# Patient Record
Sex: Male | Born: 1970
Health system: Southern US, Community
[De-identification: ages and names within clinical notes are randomized; demographics above are authoritative.]

## PROBLEM LIST (undated history)

## (undated) HISTORY — PX: HERNIA REPAIR: SHX51

## (undated) HISTORY — PX: ANTERIOR CRUCIATE LIGAMENT REPAIR: SHX115

## (undated) HISTORY — PX: TONSILLECTOMY: SUR1361

---

## 2011-02-15 ENCOUNTER — Emergency Department (HOSPITAL_COMMUNITY): Payer: Self-pay

## 2011-02-15 ENCOUNTER — Emergency Department (HOSPITAL_COMMUNITY)
Admission: EM | Admit: 2011-02-15 | Discharge: 2011-02-15 | Disposition: A | Discharge status: Home / Self Care | Payer: Self-pay | Attending: Emergency Medicine | Admitting: Emergency Medicine

## 2011-02-15 DIAGNOSIS — R079 Chest pain, unspecified: Secondary | ICD-10-CM | POA: Insufficient documentation

## 2011-02-15 DIAGNOSIS — R109 Unspecified abdominal pain: Secondary | ICD-10-CM | POA: Insufficient documentation

## 2011-02-15 DIAGNOSIS — R1011 Right upper quadrant pain: Secondary | ICD-10-CM | POA: Insufficient documentation

## 2011-02-15 LAB — COMPREHENSIVE METABOLIC PANEL
ALT: 18 U/L (ref 0–53)
AST: 18 U/L (ref 0–37)
CO2: 29 mEq/L (ref 19–32)
Calcium: 9.5 mg/dL (ref 8.4–10.5)
Chloride: 106 mEq/L (ref 96–112)
Creatinine, Ser: 0.81 mg/dL (ref 0.4–1.5)
GFR calc Af Amer: >60 mL/min (ref >60)
GFR calc non Af Amer: >60 mL/min (ref >60)
Glucose, Bld: 97 mg/dL (ref 70–99)
Sodium: 139 mEq/L (ref 135–145)
Total Bilirubin: 1 mg/dL (ref 0.3–1.2)

## 2011-02-15 LAB — CBC
HCT: 44.3 % (ref 39.0–52.0)
Hemoglobin: 15.7 g/dL (ref 13.0–17.0)
MCH: 33.4 pg (ref 26.0–34.0)
MCHC: 35.4 g/dL (ref 30.0–36.0)
RBC: 4.7 MIL/uL (ref 4.22–5.81)

## 2011-02-15 LAB — DIFFERENTIAL
Lymphocytes Relative: 24 % (ref 12–46)
Monocytes Absolute: 0.8 10*3/uL (ref 0.1–1.0)
Monocytes Relative: 8 % (ref 3–12)
Neutro Abs: 6.8 10*3/uL (ref 1.7–7.7)
Neutrophils Relative %: 66 % (ref 43–77)

## 2011-02-15 LAB — LIPASE, BLOOD: Lipase: 23 U/L (ref 11–59)

## 2012-03-28 ENCOUNTER — Emergency Department (HOSPITAL_COMMUNITY)
Admission: EM | Admit: 2012-03-28 | Discharge: 2012-03-28 | Disposition: A | Discharge status: Home / Self Care | Payer: Managed Care, Other (non HMO) | Attending: Emergency Medicine | Admitting: Emergency Medicine

## 2012-03-28 ENCOUNTER — Encounter (HOSPITAL_COMMUNITY): Payer: Self-pay | Admitting: Emergency Medicine

## 2012-03-28 ENCOUNTER — Emergency Department (HOSPITAL_COMMUNITY): Payer: Managed Care, Other (non HMO)

## 2012-03-28 DIAGNOSIS — R079 Chest pain, unspecified: Secondary | ICD-10-CM | POA: Insufficient documentation

## 2012-03-28 DIAGNOSIS — T148XXA Other injury of unspecified body region, initial encounter: Secondary | ICD-10-CM

## 2012-03-28 DIAGNOSIS — F17200 Nicotine dependence, unspecified, uncomplicated: Secondary | ICD-10-CM | POA: Insufficient documentation

## 2012-03-28 DIAGNOSIS — X58XXXA Exposure to other specified factors, initial encounter: Secondary | ICD-10-CM | POA: Insufficient documentation

## 2012-03-28 DIAGNOSIS — IMO0002 Reserved for concepts with insufficient information to code with codable children: Secondary | ICD-10-CM | POA: Insufficient documentation

## 2012-03-28 DIAGNOSIS — R109 Unspecified abdominal pain: Secondary | ICD-10-CM

## 2012-03-28 LAB — URINALYSIS, ROUTINE W REFLEX MICROSCOPIC
Glucose, UA: NEGATIVE mg/dL
Leukocytes, UA: NEGATIVE
Protein, ur: NEGATIVE mg/dL
Specific Gravity, Urine: 1.025 (ref 1.005–1.030)

## 2012-03-28 LAB — HEPATIC FUNCTION PANEL
ALT: 10 U/L (ref 0–53)
AST: 13 U/L (ref 0–37)
Albumin: 4.5 g/dL (ref 3.5–5.2)
Bilirubin, Direct: 0.1 mg/dL (ref 0.0–0.3)
Total Protein: 7.5 g/dL (ref 6.0–8.3)

## 2012-03-28 LAB — CBC
Hemoglobin: 15.9 g/dL (ref 13.0–17.0)
MCH: 32.8 pg (ref 26.0–34.0)
MCHC: 35 g/dL (ref 30.0–36.0)
Platelets: 232 10*3/uL (ref 150–400)

## 2012-03-28 LAB — DIFFERENTIAL
Basophils Absolute: 0 10*3/uL (ref 0.0–0.1)
Basophils Relative: 0 % (ref 0–1)
Eosinophils Absolute: 0.2 10*3/uL (ref 0.0–0.7)
Neutro Abs: 5.6 10*3/uL (ref 1.7–7.7)
Neutrophils Relative %: 60 % (ref 43–77)

## 2012-03-28 LAB — BASIC METABOLIC PANEL
BUN: 12 mg/dL (ref 6–23)
Chloride: 101 mEq/L (ref 96–112)
GFR calc Af Amer: >90 mL/min (ref >90)
GFR calc non Af Amer: >90 mL/min (ref >90)
Potassium: 3.9 mEq/L (ref 3.5–5.1)
Sodium: 137 mEq/L (ref 135–145)

## 2012-03-28 MED ORDER — HYDROCODONE-ACETAMINOPHEN 7.5-325 MG PO TABS: 1.0000 | ORAL_TABLET | ORAL | Status: AC | PRN

## 2012-03-28 MED ORDER — ONDANSETRON HCL 4 MG/2ML IJ SOLN
4.0000 mg | Freq: Once | INTRAMUSCULAR | Status: AC
  Administered 2012-03-28: 4 mg via INTRAVENOUS
  Filled 2012-03-28: qty 2

## 2012-03-28 MED ORDER — DICLOFENAC SODIUM 75 MG PO TBEC: 75.0000 mg | DELAYED_RELEASE_TABLET | Freq: Two times a day (BID) | ORAL | Status: AC

## 2012-03-28 MED ORDER — IOHEXOL 300 MG/ML  SOLN
100.0000 mL | Freq: Once | INTRAMUSCULAR | Status: AC | PRN
  Administered 2012-03-28: 100 mL via INTRAVENOUS

## 2012-03-28 MED ORDER — HYDROMORPHONE HCL PF 1 MG/ML IJ SOLN
1.0000 mg | Freq: Once | INTRAMUSCULAR | Status: AC
  Administered 2012-03-28: 1 mg via INTRAVENOUS
  Filled 2012-03-28: qty 1

## 2012-03-28 MED ORDER — BACLOFEN 10 MG PO TABS: 10.0000 mg | ORAL_TABLET | Freq: Three times a day (TID) | ORAL | Status: AC

## 2012-03-28 NOTE — Discharge Instructions (Signed)
Your lab tests, x-rays, and CT scans are negative for acute findings today. Please use a heating pad to the right flank area at 15-20 minute intervals. Please use baclofen 3 times daily to relax the muscle, diclofenac 2 times daily with food for inflammation, and Norco 7.5 every 4 hours as needed for pain. The baclofen and the Norco may cause drowsiness, as well as constipation. Please use with caution.Flank Pain Flank pain refers to pain that is located on the side of the body between the upper abdomen and the back. It can be caused by many things. CAUSES  Some of the more common causes of flank pain include:  Muscle strain.   Muscle spasms.   A disease of your spine (vertebral disk disease).   A lung infection (pneumonia).   Fluid around your lungs (pulmonary edema).   A kidney infection.   Kidney stones.   A very painful skin rash on only one side of your body (shingles).   Gallbladder disease.  DIAGNOSIS  Blood tests, urine tests, and X-rays may help your caregiver determine what is wrong. TREATMENT  The treatment of pain depends on the cause. Your caregiver will determine what treatment will work best for you. HOME CARE INSTRUCTIONS   Home care will depend on the cause of your pain.   Some medications may help relieve the pain. Take medication for relief of pain as directed by your caregiver.   Tell your caregiver about any changes in your pain.   Follow up with your caregiver.  SEEK IMMEDIATE MEDICAL CARE IF:   Your pain is not controlled with medication.   The pain increases.   You have abdominal pain.   You have shortness of breath.   You have persistent nausea or vomiting.   You have swelling in your abdomen.   You feel faint or pass out.   You have a temperature by mouth above 102 F (38.9 C), not controlled by medicine.  MAKE SURE YOU:   Understand these instructions.   Will watch your condition.   Will get help right away if you are not doing  well or get worse.  Document Released: 01/26/2006 Document Revised: 11/24/2011 Document Reviewed: 05/22/2010 [facility] Patient Information 2012 [place], [place].

## 2012-03-28 NOTE — ED Notes (Signed)
Pt c/o rt flank pain x one week.

## 2012-03-28 NOTE — ED Provider Notes (Signed)
   Medical screening examination/treatment/procedure(s) were performed by non-physician practitioner and as supervising physician I was immediately available for consultation/collaboration.  Shelda Jakes, MD 03/28/12 [phone]

## 2012-03-28 NOTE — ED Provider Notes (Signed)
History     CSN: [id number]  Arrival date & time 03/28/12  9562   First MD Initiated Contact with Patient 03/28/12 [phone]      Chief Complaint  Patient presents with  . Flank Pain    (Consider location/radiation/quality/duration/timing/severity/associated sxs/prior treatment) Patient is a 41 y.o. male presenting with flank pain. The history is provided by the patient.  Flank Pain This is a recurrent problem. The current episode started in the past 7 days. The problem has been gradually worsening. Pertinent negatives include no abdominal pain, arthralgias, change in bowel habit, chest pain, chills, coughing, fever, nausea, neck pain or vomiting. Associated symptoms comments: No dysuria. The symptoms are aggravated by exertion (lifting). He has tried NSAIDs for the symptoms. The treatment provided no relief.    History reviewed. No pertinent past medical history.  Past Surgical History  Procedure Date  . Anterior cruciate ligament repair   . Tonsillectomy   . Hernia repair     History reviewed. No pertinent family history.  History  Substance Use Topics  . Smoking status: Current Everyday Smoker    Types: Cigarettes  . Smokeless tobacco: Not on file  . Alcohol Use: No      Review of Systems  Constitutional: Negative for fever, chills and activity change.       All ROS Neg except as noted in HPI  HENT: Negative for nosebleeds and neck pain.   Eyes: Negative for photophobia and discharge.  Respiratory: Negative for cough, shortness of breath and wheezing.   Cardiovascular: Negative for chest pain and palpitations.  Gastrointestinal: Negative for nausea, vomiting, abdominal pain, blood in stool and change in bowel habit.  Genitourinary: Positive for flank pain. Negative for dysuria, frequency and hematuria.  Musculoskeletal: Negative for back pain and arthralgias.  Skin: Negative.   Neurological: Negative for dizziness, seizures and speech difficulty.    Psychiatric/Behavioral: Negative for hallucinations and confusion.    Allergies  Review of patient's allergies indicates no known allergies.  Home Medications  No current outpatient prescriptions on file.  BP 120/74  Pulse 70  Temp(Src) 98.1 F (36.7 C) (Oral)  Resp 17  Ht 5\' 7"  (1.702 m)  Wt 160 lb (72.576 kg)  BMI 25.06 kg/m2  SpO2 95%  Physical Exam  Nursing note and vitals reviewed. Constitutional: He is oriented to person, place, and time. He appears well-developed and well-nourished.  Non-toxic appearance.  HENT:  Head: Normocephalic.  Right Ear: Tympanic membrane and external ear normal.  Left Ear: Tympanic membrane and external ear normal.  Eyes: EOM and lids are normal. Pupils are equal, round, and reactive to light.  Neck: Normal range of motion. Neck supple. Carotid bruit is not present.  Cardiovascular: Normal rate, regular rhythm, normal heart sounds, intact distal pulses and normal pulses.   Pulmonary/Chest: Breath sounds normal. No respiratory distress.       Pain to palpation and movement of the right flank area. No hot areas. No palpable deformity appreciated.  Abdominal: Soft. Bowel sounds are normal.       Right upper quadrant abdomen tenderness to palpation. No CVA tenderness.  Musculoskeletal: Normal range of motion.  Lymphadenopathy:       Head (right side): No submandibular adenopathy present.       Head (left side): No submandibular adenopathy present.    He has no cervical adenopathy.  Neurological: He is alert and oriented to person, place, and time. He has normal strength. No cranial nerve deficit or sensory deficit.  Skin:  Skin is warm and dry.  Psychiatric: He has a normal mood and affect. His speech is normal.    ED Course  Procedures (including critical care time)  Labs Reviewed - No data to display No results found.   No diagnosis found.    MDM  I have reviewed nursing notes, vital signs, and all appropriate lab and imaging  results for this patient. Complete blood count, basic metabolic panel, lipase, and CT abdomen all negative for acute findings. Chest x-ray negative for acute findings. Suspect musculoskeletal related flank pain. Plan at this time will be for the use of a heating pad to the flank area. Baclofen 3 times daily, Voltaren 2 times daily with food, and Norco 7.5 mg every 4-6 hours as needed.       Kathie Dike, [place] 03/28/12 1146

## 2013-10-24 ENCOUNTER — Ambulatory Visit (INDEPENDENT_AMBULATORY_CARE_PROVIDER_SITE_OTHER): Payer: 59 | Admitting: *Deleted

## 2013-10-24 DIAGNOSIS — Z23 Encounter for immunization: Secondary | ICD-10-CM

## 2014-04-23 ENCOUNTER — Encounter: Payer: Self-pay | Admitting: Family Medicine

## 2014-04-23 ENCOUNTER — Ambulatory Visit (INDEPENDENT_AMBULATORY_CARE_PROVIDER_SITE_OTHER): Payer: 59 | Admitting: Family Medicine

## 2014-04-23 VITALS — BP 130/82 | Temp 97.6°F | Ht 67.0 in | Wt 148.0 lb

## 2014-04-23 DIAGNOSIS — Z720 Tobacco use: Secondary | ICD-10-CM | POA: Insufficient documentation

## 2014-04-23 DIAGNOSIS — A088 Other specified intestinal infections: Secondary | ICD-10-CM

## 2014-04-23 DIAGNOSIS — A084 Viral intestinal infection, unspecified: Secondary | ICD-10-CM

## 2014-04-23 DIAGNOSIS — F17200 Nicotine dependence, unspecified, uncomplicated: Secondary | ICD-10-CM

## 2014-04-23 MED ORDER — ONDANSETRON 8 MG PO TBDP: 8.0000 mg | ORAL_TABLET | Freq: Three times a day (TID) | ORAL | Status: DC | PRN

## 2014-04-23 NOTE — Progress Notes (Signed)
   Subjective:    Patient ID: Jacob Oconnor, male    DOB: 04/06/1971, 43 y.o.   MRN: [id number]  Emesis  This is a new problem. The current episode started yesterday. The problem occurs 2 to 4 times per day. The emesis has an appearance of stomach contents and bile. Associated symptoms include abdominal pain, chills, diarrhea and sweats. Treatments tried: Ibu. The treatment provided mild relief.  Diarrhea  Associated symptoms include abdominal pain, chills, sweats and vomiting.   pmh benign Tried ibuprofen  Review of Systems  Constitutional: Positive for chills.  Gastrointestinal: Positive for vomiting, abdominal pain and diarrhea.       Objective:   Physical Exam  Lungs are clear hearts regular abdomen soft no guarding rebound or tenderness mucous membranes moist skin turgor good capillary refill good vital signs noted      Assessment & Plan:  1. Viral gastroenteritis Viral gastroenteritis supportive measures discussed followup if ongoing troubles medications and then may need a shot of Phenergan if worse call us if any issues should be better over the next 48 hours

## 2014-04-23 NOTE — Patient Instructions (Signed)
Viral Gastroenteritis Viral gastroenteritis is also known as stomach flu. This condition affects the stomach and intestinal tract. It can cause sudden diarrhea and vomiting. The illness typically lasts 3 to 8 days. Most people develop an immune response that eventually gets rid of the virus. While this natural response develops, the virus can make you quite ill. CAUSES  Many different viruses can cause gastroenteritis, such as rotavirus or noroviruses. You can catch one of these viruses by consuming contaminated food or water. You may also catch a virus by sharing utensils or other personal items with an infected person or by touching a contaminated surface. SYMPTOMS  The most common symptoms are diarrhea and vomiting. These problems can cause a severe loss of body fluids (dehydration) and a body salt (electrolyte) imbalance. Other symptoms may include:  Fever.  Headache.  Fatigue.  Abdominal pain. DIAGNOSIS  Your caregiver can usually diagnose viral gastroenteritis based on your symptoms and a physical exam. A stool sample may also be taken to test for the presence of viruses or other infections. TREATMENT  This illness typically goes away on its own. Treatments are aimed at rehydration. The most serious cases of viral gastroenteritis involve vomiting so severely that you are not able to keep fluids down. In these cases, fluids must be given through an intravenous line (IV). HOME CARE INSTRUCTIONS   Drink enough fluids to keep your urine clear or pale yellow. Drink small amounts of fluids frequently and increase the amounts as tolerated.  Ask your caregiver for specific rehydration instructions.  Avoid:  Foods high in sugar.  Alcohol.  Carbonated drinks.  Tobacco.  Juice.  Caffeine drinks.  Extremely hot or cold fluids.  Fatty, greasy foods.  Too much intake of anything at one time.  Dairy products until 24 to 48 hours after diarrhea stops.  You may consume probiotics.  Probiotics are active cultures of beneficial bacteria. They may lessen the amount and number of diarrheal stools in adults. Probiotics can be found in yogurt with active cultures and in supplements.  Wash your hands well to avoid spreading the virus.  Only take over-the-counter or prescription medicines for pain, discomfort, or fever as directed by your caregiver. Do not give aspirin to children. Antidiarrheal medicines are not recommended.  Ask your caregiver if you should continue to take your regular prescribed and over-the-counter medicines.  Keep all follow-up appointments as directed by your caregiver. SEEK IMMEDIATE MEDICAL CARE IF:   You are unable to keep fluids down.  You do not urinate at least once every 6 to 8 hours.  You develop shortness of breath.  You notice blood in your stool or vomit. This may look like coffee grounds.  You have abdominal pain that increases or is concentrated in one small area (localized).  You have persistent vomiting or diarrhea.  You have a fever.  The patient is a child younger than 3 months, and he or she has a fever.  The patient is a child older than 3 months, and he or she has a fever and persistent symptoms.  The patient is a child older than 3 months, and he or she has a fever and symptoms suddenly get worse.  The patient is a baby, and he or she has no tears when crying. MAKE SURE YOU:   Understand these instructions.  Will watch your condition.  Will get help right away if you are not doing well or get worse. Document Released: 12/05/2005 Document Revised: 02/27/2012 Document Reviewed: 09/21/2011   [employer] Patient Information 2014 [employer].  

## 2014-12-16 ENCOUNTER — Encounter: Payer: Self-pay | Admitting: Family Medicine

## 2014-12-16 ENCOUNTER — Ambulatory Visit (INDEPENDENT_AMBULATORY_CARE_PROVIDER_SITE_OTHER): Payer: PRIVATE HEALTH INSURANCE | Admitting: Family Medicine

## 2014-12-16 VITALS — Temp 98.1°F | Ht 67.0 in | Wt 144.0 lb

## 2014-12-16 DIAGNOSIS — B349 Viral infection, unspecified: Secondary | ICD-10-CM

## 2014-12-16 DIAGNOSIS — J208 Acute bronchitis due to other specified organisms: Secondary | ICD-10-CM

## 2014-12-16 MED ORDER — LEVOFLOXACIN 500 MG PO TABS: 500.0000 mg | ORAL_TABLET | Freq: Every day | ORAL | Status: DC

## 2014-12-16 NOTE — Progress Notes (Deleted)
   Subjective:    Patient ID: Jacob Oconnor, male    DOB: 09/21/1971, 43 y.o.   MRN: [id number]  HPI    Review of Systems     Objective:   Physical Exam        Assessment & Plan:

## 2014-12-16 NOTE — Progress Notes (Signed)
   Subjective:    Patient ID: Jacob Oconnor, male    DOB: 12/04/1971, 43 y.o.   MRN: [id number]  Cough This is a new problem. The current episode started 1 to 4 weeks ago. Associated symptoms include a fever (a couple times), nasal congestion, rhinorrhea and shortness of breath (occasional). Pertinent negatives include no chest pain, ear pain or wheezing.   Patient is a smoker denies any severe troubles currently except for cough that has lingered on for the past couple weeks with increased congestion and coughing   Review of Systems  Constitutional: Positive for fever (a couple times) and fatigue. Negative for activity change.  HENT: Positive for congestion and rhinorrhea. Negative for ear pain.   Eyes: Negative for discharge.  Respiratory: Positive for cough and shortness of breath (occasional). Negative for wheezing.   Cardiovascular: Negative for chest pain.       Objective:   Physical Exam Eardrums normal throat normal neck supple lungs there is some chest congestion noted on the right side but no crackles or rails not respiratory distress       Assessment & Plan:  Viral syndrome Severe bronchitis versus early pneumonia Antibiotics prescribed warning signs discussed X-rays injections or other treatments not necessary currently Patient was counseled to quit smoking Patient was told that if he does not completely clear of the cough over the next 10-14 days he needs to let us know and we will set him up for chest x-ray and follow-up

## 2016-02-17 ENCOUNTER — Telehealth: Payer: Self-pay | Admitting: Family Medicine

## 2016-02-17 MED ORDER — ONDANSETRON 8 MG PO TBDP: 8.0000 mg | ORAL_TABLET | Freq: Three times a day (TID) | ORAL | Status: DC | PRN

## 2016-02-17 NOTE — Telephone Encounter (Signed)
Notified patient the approach to this is diluted Gatorade. One half liquid Gatorade with one half water, Zofran 8 mg 1 3 times a day when necessary nausea #20 one may use disintegrating tablets if necessary, 2 refills, Imodium when necessary, if persistent symptoms along with inability to keep anything down may end up needing they have to go to the ER especially if severe dizziness lack of urination etc. Med sent to pharmacy. Patient verbalized understanding.

## 2016-02-17 NOTE — Telephone Encounter (Signed)
The approach to this is diluted Gatorade. One half liquid Gatorade with one half water, Zofran 8 mg 1 3 times a day when necessary nausea #20 one may use disintegrating tablets if necessary, 2 refills, Imodium when necessary, if persistent symptoms along with inability to keep anything down may end up needing they have to go to the ER especially if severe dizziness lack of urination etc.

## 2016-02-17 NOTE — Telephone Encounter (Signed)
Pt is with no fever but has been having diarrhea, vomiting and nausea for  2 1/2 days would like to know if there is anything that can be sent in for him.  As well as is there anything he can be doing at home to keep himself from being Dehydrated.       [facility]

## 2016-02-19 ENCOUNTER — Telehealth: Payer: Self-pay | Admitting: Family Medicine

## 2016-02-19 NOTE — Telephone Encounter (Signed)
Error

## 2016-08-04 ENCOUNTER — Ambulatory Visit (INDEPENDENT_AMBULATORY_CARE_PROVIDER_SITE_OTHER): Payer: BLUE CROSS/BLUE SHIELD | Admitting: Family Medicine

## 2016-08-04 ENCOUNTER — Encounter: Payer: Self-pay | Admitting: Family Medicine

## 2016-08-04 VITALS — BP 114/72 | Temp 98.4°F | Ht 68.0 in | Wt 154.0 lb

## 2016-08-04 DIAGNOSIS — B9689 Other specified bacterial agents as the cause of diseases classified elsewhere: Secondary | ICD-10-CM

## 2016-08-04 DIAGNOSIS — J309 Allergic rhinitis, unspecified: Secondary | ICD-10-CM

## 2016-08-04 DIAGNOSIS — J019 Acute sinusitis, unspecified: Secondary | ICD-10-CM | POA: Diagnosis not present

## 2016-08-04 MED ORDER — AZELASTINE HCL 0.1 % NA SOLN: 2.0000 | Freq: Two times a day (BID) | NASAL | 12 refills | Status: DC

## 2016-08-04 MED ORDER — AMOXICILLIN-POT CLAVULANATE 875-125 MG PO TABS: 1.0000 | ORAL_TABLET | Freq: Two times a day (BID) | ORAL | 0 refills | Status: DC

## 2016-08-04 NOTE — Progress Notes (Signed)
   Subjective:    Patient ID: Jacob Oconnor, male    DOB: 07/14/1971, 45 y.o.   MRN: [id number]  Sinusitis  This is a new problem. Episode onset: 2 days. (Fever, sneezing, cough, headahce)   Patient with head congestion drainage coughing sneezing not feeling good denies nausea vomiting relates low-grade fever no wheezing no difficulty breathing. Moderate allergy issues.   Review of Systems See above.    Objective:   Physical Exam Nears congested eardrums normal throat is normal neck supple lungs clear heart regular       Assessment & Plan:  Allergies Low-grade fever febrile illness Acute rhinosinusitis Antibiotics allergy nasal spray continue OTC measures follow-up if progressive troubles

## 2016-08-05 ENCOUNTER — Encounter: Payer: Self-pay | Admitting: Family Medicine

## 2016-09-02 ENCOUNTER — Encounter: Payer: Self-pay | Admitting: Family Medicine

## 2016-09-02 ENCOUNTER — Ambulatory Visit (INDEPENDENT_AMBULATORY_CARE_PROVIDER_SITE_OTHER): Payer: BLUE CROSS/BLUE SHIELD | Admitting: Family Medicine

## 2016-09-02 VITALS — BP 118/80 | Temp 98.4°F | Ht 68.0 in | Wt 155.6 lb

## 2016-09-02 DIAGNOSIS — J019 Acute sinusitis, unspecified: Secondary | ICD-10-CM | POA: Diagnosis not present

## 2016-09-02 DIAGNOSIS — J209 Acute bronchitis, unspecified: Secondary | ICD-10-CM | POA: Diagnosis not present

## 2016-09-02 MED ORDER — ALBUTEROL SULFATE HFA 108 (90 BASE) MCG/ACT IN AERS: 2.0000 | INHALATION_SPRAY | Freq: Four times a day (QID) | RESPIRATORY_TRACT | 2 refills | Status: DC | PRN

## 2016-09-02 MED ORDER — LEVOFLOXACIN 500 MG PO TABS: 500.0000 mg | ORAL_TABLET | Freq: Every day | ORAL | 0 refills | Status: DC

## 2016-09-02 NOTE — Progress Notes (Signed)
   Subjective:    Patient ID: Jacob Oconnor, male    DOB: 09/27/1971, 45 y.o.   MRN: [id number]  Sinusitis  This is a new problem. Episode onset: one week. Associated symptoms include congestion, coughing, headaches, sneezing and a sore throat. (Wheezing, fever) Past treatments include acetaminophen (mucinex, cough drops).   Patient relates about a week of head congestion drainage coughing now into his chest with chest congestion coughing and a little bit of low-grade sweats and chills denies vomiting diarrhea   Review of Systems  HENT: Positive for congestion, sneezing and sore throat.   Respiratory: Positive for cough.   Neurological: Positive for headaches.       Objective:   Physical Exam Lungs course cough noted were bronchial cough noted neck no masses HEENT is benign mild sinus tenderness not respiratory distress       Assessment & Plan:  Viral syndrome Secondary rhinosinusitis Allergy issues use allergy medicine Acute bronchitis Because of the above problems going on for a week and low-grade fevers recommend antibiotics Albuterol when necessary for wheezing Patient counseled to quit smoking Steroids not indicated If progressively worse over next several days to call us. Follow-up sooner if any issues.

## 2016-11-24 ENCOUNTER — Ambulatory Visit (INDEPENDENT_AMBULATORY_CARE_PROVIDER_SITE_OTHER): Payer: BLUE CROSS/BLUE SHIELD | Admitting: Family Medicine

## 2016-11-24 ENCOUNTER — Encounter: Payer: Self-pay | Admitting: Family Medicine

## 2016-11-24 VITALS — BP 122/82 | Temp 98.0°F | Ht 68.0 in | Wt 150.8 lb

## 2016-11-24 DIAGNOSIS — B338 Other specified viral diseases: Secondary | ICD-10-CM

## 2016-11-24 DIAGNOSIS — J019 Acute sinusitis, unspecified: Secondary | ICD-10-CM | POA: Diagnosis not present

## 2016-11-24 DIAGNOSIS — B348 Other viral infections of unspecified site: Secondary | ICD-10-CM

## 2016-11-24 MED ORDER — AZITHROMYCIN 250 MG PO TABS: ORAL_TABLET | ORAL | 0 refills | Status: DC

## 2016-11-24 NOTE — Progress Notes (Signed)
   Subjective:    Patient ID: Jacob Oconnor, male    DOB: 02/05/1971, 45 y.o.   MRN: [id number]  Cough  This is a new problem. The current episode started in the past 7 days. Associated symptoms include a fever, headaches, nasal congestion, a sore throat and wheezing. He has tried OTC cough suppressant (mucinex dm) for the symptoms.   Patient is having low bit of body aches low-grade fever not feeling good some head congestion no drainage no wheezing or difficulty breathing   Review of Systems  Constitutional: Positive for fever.  HENT: Positive for sore throat.   Respiratory: Positive for cough and wheezing.   Neurological: Positive for headaches.       Objective:   Physical Exam Does not appear toxic eardrums normal throat normal neck supple lungs clear heart regular       Assessment & Plan:  Viral syndrome Probable parainfluenza Rest up over the next few days Patient has a tendency toward bronchial infections he was given a prescription of antibiotics just in case that if this gets worse he will get it filled in a few days he will call us if any problems

## 2016-12-08 ENCOUNTER — Ambulatory Visit (INDEPENDENT_AMBULATORY_CARE_PROVIDER_SITE_OTHER): Payer: BLUE CROSS/BLUE SHIELD | Admitting: *Deleted

## 2016-12-08 DIAGNOSIS — Z23 Encounter for immunization: Secondary | ICD-10-CM | POA: Diagnosis not present

## 2017-04-04 ENCOUNTER — Ambulatory Visit (INDEPENDENT_AMBULATORY_CARE_PROVIDER_SITE_OTHER): Payer: BLUE CROSS/BLUE SHIELD | Admitting: Family Medicine

## 2017-04-04 ENCOUNTER — Telehealth: Payer: Self-pay | Admitting: Family Medicine

## 2017-04-04 ENCOUNTER — Encounter: Payer: Self-pay | Admitting: Family Medicine

## 2017-04-04 VITALS — BP 116/88 | Ht 68.0 in | Wt 153.6 lb

## 2017-04-04 DIAGNOSIS — J019 Acute sinusitis, unspecified: Secondary | ICD-10-CM

## 2017-04-04 DIAGNOSIS — B349 Viral infection, unspecified: Secondary | ICD-10-CM

## 2017-04-04 DIAGNOSIS — R112 Nausea with vomiting, unspecified: Secondary | ICD-10-CM

## 2017-04-04 MED ORDER — ONDANSETRON HCL 8 MG PO TABS: 8.0000 mg | ORAL_TABLET | Freq: Three times a day (TID) | ORAL | 0 refills | Status: DC | PRN

## 2017-04-04 MED ORDER — LEVOFLOXACIN 500 MG PO TABS: 500.0000 mg | ORAL_TABLET | Freq: Every day | ORAL | 0 refills | Status: DC

## 2017-04-04 NOTE — Telephone Encounter (Signed)
Spoke with patient and informed him that insurance will only pay for #12 on Zofran and per Dr.Scott we can send in 12 tablets. Patient verbalized understanding.

## 2017-04-04 NOTE — Telephone Encounter (Signed)
Patient was prescribed Ondansetron today by Dr. Lorin Picket.  Patient says that [facility] told him that they sent [place] information to review regarding the Rx and we have not got back with them.  He is requesting our office to contact them ASAP to get this taken care of.

## 2017-04-04 NOTE — Progress Notes (Signed)
   Subjective:    Patient ID: Jacob Oconnor, male    DOB: 1971/07/21, 46 y.o.   MRN: [id number]  Cough  This is a new problem. The current episode started in the past 7 days. The problem has been gradually worsening. Associated symptoms include chills, a fever, headaches and a sore throat. Associated symptoms comments: Vomiting, yellow/green mucus. He has tried OTC cough suppressant for the symptoms. The treatment provided no relief.   From Thursday on word he had head congestion drainage coughing sinus pressure not feeling good denies high fevers but did relate some fever no wheezing her body aches   Review of Systems  Constitutional: Positive for chills and fever.  HENT: Positive for sore throat.   Respiratory: Positive for cough.   Neurological: Positive for headaches.       Objective:   Physical Exam  Constitutional: He appears well-developed.  HENT:  Head: Normocephalic.  Mouth/Throat: Oropharynx is clear and moist. No oropharyngeal exudate.  Neck: Normal range of motion.  Cardiovascular: Normal rate, regular rhythm and normal heart sounds.   No murmur heard. Pulmonary/Chest: Effort normal and breath sounds normal. He has no wheezes.  Lymphadenopathy:    He has no cervical adenopathy.  Neurological: He exhibits normal muscle tone.  Skin: Skin is warm and dry.  Nursing note and vitals reviewed.         Assessment & Plan:  Viral syndrome Secondary rhinosinusitis No pneumonia Antibiotics prescribed warning signs discussed Follow-up if progressive troubles Work excuse given.

## 2017-09-18 ENCOUNTER — Encounter: Payer: Self-pay | Admitting: Family Medicine

## 2017-09-18 ENCOUNTER — Ambulatory Visit (INDEPENDENT_AMBULATORY_CARE_PROVIDER_SITE_OTHER): Payer: BLUE CROSS/BLUE SHIELD | Admitting: Family Medicine

## 2017-09-18 DIAGNOSIS — J019 Acute sinusitis, unspecified: Secondary | ICD-10-CM

## 2017-09-18 DIAGNOSIS — J069 Acute upper respiratory infection, unspecified: Secondary | ICD-10-CM

## 2017-09-18 MED ORDER — AMOXICILLIN-POT CLAVULANATE 875-125 MG PO TABS: 1.0000 | ORAL_TABLET | Freq: Two times a day (BID) | ORAL | 0 refills | Status: DC

## 2017-09-18 MED ORDER — HYDROCODONE-HOMATROPINE 5-1.5 MG/5ML PO SYRP: ORAL_SOLUTION | ORAL | 0 refills | Status: DC

## 2017-09-18 NOTE — Progress Notes (Signed)
   Subjective:    Patient ID: Jacob Oconnor, male    DOB: 10/23/1971, 46 y.o.   MRN: [id number]  Cough  This is a new problem. The current episode started in the past 7 days. Associated symptoms include a fever, headaches, nasal congestion, rhinorrhea and a sore throat. Pertinent negatives include no chest pain, ear pain or wheezing. Associated symptoms comments: theraflu. Treatments tried: otc cold med.   PMH benign Patient is a smoker he does need to quit we discussed this at length  Review of Systems  Constitutional: Positive for fever. Negative for activity change.  HENT: Positive for congestion, rhinorrhea and sore throat. Negative for ear pain.   Eyes: Negative for discharge.  Respiratory: Positive for cough. Negative for wheezing.   Cardiovascular: Negative for chest pain.  Neurological: Positive for headaches.       Objective:   Physical Exam  Constitutional: He appears well-developed.  HENT:  Head: Normocephalic.  Mouth/Throat: Oropharynx is clear and moist. No oropharyngeal exudate.  Neck: Normal range of motion.  Cardiovascular: Normal rate, regular rhythm and normal heart sounds.   No murmur heard. Pulmonary/Chest: Effort normal and breath sounds normal. He has no wheezes.  Lymphadenopathy:    He has no cervical adenopathy.  Neurological: He exhibits normal muscle tone.  Skin: Skin is warm and dry.  Nursing note and vitals reviewed.    Moderate sinus tenderness bilateral     Assessment & Plan:  Viral illness Secondary rhinosinusitis Hycodan for cough at nighttime caution drowsiness Follow-up if progressive troubles or worse  His wife is trying Wellbutrin to see fill help her quit she will he will consider trying Wellbutrin he will let us know.

## 2017-09-21 ENCOUNTER — Encounter: Payer: Self-pay | Admitting: Family Medicine

## 2017-09-25 ENCOUNTER — Encounter: Payer: Self-pay | Admitting: Family Medicine

## 2017-11-01 ENCOUNTER — Ambulatory Visit (INDEPENDENT_AMBULATORY_CARE_PROVIDER_SITE_OTHER): Payer: BLUE CROSS/BLUE SHIELD | Admitting: *Deleted

## 2017-11-01 DIAGNOSIS — Z23 Encounter for immunization: Secondary | ICD-10-CM | POA: Diagnosis not present

## 2017-11-16 ENCOUNTER — Encounter: Payer: Self-pay | Admitting: Family Medicine

## 2017-11-16 ENCOUNTER — Ambulatory Visit: Payer: BLUE CROSS/BLUE SHIELD | Admitting: Family Medicine

## 2017-11-16 VITALS — Temp 97.9°F | Ht 68.0 in | Wt 153.6 lb

## 2017-11-16 DIAGNOSIS — J019 Acute sinusitis, unspecified: Secondary | ICD-10-CM

## 2017-11-16 MED ORDER — AMOXICILLIN-POT CLAVULANATE 875-125 MG PO TABS: 1.0000 | ORAL_TABLET | Freq: Two times a day (BID) | ORAL | 0 refills | Status: DC

## 2017-11-16 NOTE — Progress Notes (Signed)
   Subjective:    Patient ID: Jacob Oconnor, male    DOB: 07/01/1971, 46 y.o.   MRN: [id number]  Cough  This is a new problem. The current episode started in the past 7 days. Associated symptoms include a fever, nasal congestion and a sore throat. Treatments tried: otc cold meds.  Viral-like illness several days now with head congestion sinus pressure pain discomfort denies wheezing difficulty breathing denies vomiting or diarrhea PMH benign does smoke    Review of Systems  Constitutional: Positive for fever.  HENT: Positive for sore throat.   Respiratory: Positive for cough.    Mild sinus headache no vomiting no diarrhea no muscle aches    Objective:   Physical Exam  Eardrums normal throat is normal moderate sinus tenderness neck supple no masses lungs clear heart regular      Assessment & Plan:  Viral syndrome Secondary acute rhinosinusitis Antibiotics prescribed warning signs discussed follow-up if problems Recheck if progressive troubles

## 2017-12-08 ENCOUNTER — Telehealth: Payer: Self-pay | Admitting: Family Medicine

## 2017-12-08 MED ORDER — CHLORZOXAZONE 500 MG PO TABS: 500.0000 mg | ORAL_TABLET | Freq: Three times a day (TID) | ORAL | 0 refills | Status: DC | PRN

## 2017-12-08 MED ORDER — NABUMETONE 750 MG PO TABS: 750.0000 mg | ORAL_TABLET | Freq: Two times a day (BID) | ORAL | 0 refills | Status: DC

## 2017-12-08 NOTE — Addendum Note (Signed)
Addended by: Margaretha SheffieldBROWN, Echo Propp S on: 12/08/2017 03:41 PM   Modules accepted: Orders

## 2017-12-08 NOTE — Telephone Encounter (Signed)
Prescriptions sent electronically to pharmacy. Patient notified. °

## 2017-12-08 NOTE — Telephone Encounter (Signed)
Pt called stating that he pulled a muscle in his lower abdomen a couple days at work and ibuprofen isnt relieving the pain. Pt states that he has done this in the past. Pt is wanting to know if something for the pain can be called in. Please advise.   [facility]

## 2017-12-08 NOTE — Telephone Encounter (Signed)
relafen 750 bid with food 20  Chlorzoxazone 500 30 one tid

## 2017-12-13 ENCOUNTER — Telehealth: Payer: Self-pay

## 2017-12-13 MED ORDER — NABUMETONE 750 MG PO TABS: 750.0000 mg | ORAL_TABLET | Freq: Two times a day (BID) | ORAL | 0 refills | Status: DC

## 2017-12-13 NOTE — Telephone Encounter (Signed)
Ok times one 

## 2017-12-13 NOTE — Telephone Encounter (Signed)
Pt is requesting a refill on Nabumetone 750 mg one po BID with food.Last seen 09/18/2017 for a cough.Please advise.

## 2017-12-13 NOTE — Addendum Note (Signed)
Addended by: Meredith LeedsSUTTON, Samara Stankowski L on: 12/13/2017 03:57 PM   Modules accepted: Orders

## 2017-12-27 ENCOUNTER — Other Ambulatory Visit: Payer: Self-pay | Admitting: *Deleted

## 2017-12-27 MED ORDER — NABUMETONE 750 MG PO TABS: 750.0000 mg | ORAL_TABLET | Freq: Two times a day (BID) | ORAL | 0 refills | Status: DC

## 2018-01-11 ENCOUNTER — Other Ambulatory Visit: Payer: Self-pay | Admitting: *Deleted

## 2018-01-12 ENCOUNTER — Other Ambulatory Visit: Payer: Self-pay | Admitting: *Deleted

## 2018-02-01 ENCOUNTER — Encounter: Payer: Self-pay | Admitting: Family Medicine

## 2018-02-01 ENCOUNTER — Ambulatory Visit: Payer: BLUE CROSS/BLUE SHIELD | Admitting: Family Medicine

## 2018-02-01 VITALS — BP 116/78 | Temp 98.0°F | Ht 68.0 in | Wt 157.2 lb

## 2018-02-01 DIAGNOSIS — J019 Acute sinusitis, unspecified: Secondary | ICD-10-CM | POA: Diagnosis not present

## 2018-02-01 MED ORDER — AMOXICILLIN-POT CLAVULANATE 875-125 MG PO TABS: 1.0000 | ORAL_TABLET | Freq: Two times a day (BID) | ORAL | 0 refills | Status: DC

## 2018-02-01 MED ORDER — AMOXICILLIN-POT CLAVULANATE 875-125 MG PO TABS: 1.0000 | ORAL_TABLET | Freq: Two times a day (BID) | ORAL | 0 refills | Status: AC

## 2018-02-01 NOTE — Progress Notes (Signed)
   Subjective:    Patient ID: Jacob Oconnor, male    DOB: 08/22/1971, 47 y.o.   MRN: [id number]  Cough  The current episode started in the past 7 days. Associated symptoms include a fever, headaches, rhinorrhea and a sore throat. Treatments tried: DELSUM, TYLENOL. The treatment provided mild relief.    Coughing fever and haeache and weakness  Pos fever   achey in muscles and joints dim energy   Energy level down   Review of Systems  Constitutional: Positive for fever.  HENT: Positive for rhinorrhea and sore throat.   Respiratory: Positive for cough.   Neurological: Positive for headaches.       Objective:   Physical Exam  Alert vitals reviewed, moderate malaise. Hydration good. Positive nasal congestion lungs no crackles or wheezes, no tachypnea, intermittent bronchial cough during exam heart regular rate and rhythm.       Assessment & Plan:  Impression influenza discussed at length. Ashby Dawesature of illness and potential sequela discussed. Plan Tamiflu prescribed if indicated and timing appropriate. Symptom care discussed. Warning signs discussed. WSL Too late for Tamiflu discussed distinct frontal headache positive gunky and frequent sinus in the past will cover standard antibiotic Augmentin

## 2018-04-30 ENCOUNTER — Ambulatory Visit: Payer: BLUE CROSS/BLUE SHIELD | Admitting: Family Medicine

## 2018-04-30 ENCOUNTER — Encounter: Payer: Self-pay | Admitting: Family Medicine

## 2018-04-30 VITALS — BP 120/76 | Temp 98.0°F | Ht 68.0 in | Wt 152.2 lb

## 2018-04-30 DIAGNOSIS — L03312 Cellulitis of back [any part except buttock]: Secondary | ICD-10-CM | POA: Diagnosis not present

## 2018-04-30 DIAGNOSIS — S20469A Insect bite (nonvenomous) of unspecified back wall of thorax, initial encounter: Secondary | ICD-10-CM

## 2018-04-30 DIAGNOSIS — W57XXXA Bitten or stung by nonvenomous insect and other nonvenomous arthropods, initial encounter: Secondary | ICD-10-CM

## 2018-04-30 MED ORDER — DOXYCYCLINE HYCLATE 100 MG PO CAPS: 100.0000 mg | ORAL_CAPSULE | Freq: Two times a day (BID) | ORAL | 0 refills | Status: DC

## 2018-04-30 NOTE — Progress Notes (Signed)
   Subjective:    Patient ID: Jacob Oconnor, male    DOB: 07/09/1971, 47 y.o.   MRN: [id number]  Animal Bite   The incident occurred more than 2 days ago.  This patient states he was bit by a tick last week his wife removed it it was a fairly large tic with a white spot it is cause some localized redness itching burning now he started to have pain and discomfort throughout his back denies high fever chills denies nausea vomiting diarrhea denies sweats tremors.  States that this kicked in over the past few days and the area around the tick bite progressively more red and spreading.  Pt states wife removed tick from back last Thursday and applied peroxide and TAO. Pt states he is now aching in his back.     Review of Systems Please see above.  Negative for headaches vomiting diarrhea cough wheeze    Objective:   Physical Exam Lungs clear respiratory rate normal pulse normal vital signs noted reddened area on the mid back approximately 1 inch in diameter with some thickening in the skin consistent with some cellulitis around the site of the tick bite       Assessment & Plan:  Tick bite Meat allergy warning signs discussed If progressive troubles or worse follow-up Doxycycline twice daily for 10 days Take with a snack in a tall glass of water Avoid excessive sun Warning signs regarding progressive tick related illness discussed.

## 2018-04-30 NOTE — Patient Instructions (Signed)

## 2018-05-21 ENCOUNTER — Encounter: Payer: Self-pay | Admitting: Family Medicine

## 2018-05-21 ENCOUNTER — Ambulatory Visit: Payer: BLUE CROSS/BLUE SHIELD | Admitting: Family Medicine

## 2018-05-21 VITALS — Temp 97.9°F | Ht 68.0 in | Wt 149.2 lb

## 2018-05-21 DIAGNOSIS — A084 Viral intestinal infection, unspecified: Secondary | ICD-10-CM | POA: Diagnosis not present

## 2018-05-21 MED ORDER — DICYCLOMINE HCL 20 MG PO TABS: 20.0000 mg | ORAL_TABLET | Freq: Three times a day (TID) | ORAL | 1 refills | Status: DC | PRN

## 2018-05-21 MED ORDER — ONDANSETRON HCL 8 MG PO TABS: 8.0000 mg | ORAL_TABLET | Freq: Three times a day (TID) | ORAL | 1 refills | Status: DC | PRN

## 2018-05-21 NOTE — Progress Notes (Signed)
   Subjective:    Patient ID: Jacob Oconnor, male    DOB: 12/18/1971, 47 y.o.   MRN: [id number]  HPI  Patient arrives with vomiting, diarrhea and abdominal pain for a few days. Severe viral gastroenteritis started few days ago with diarrhea then progressed to nausea then frequent vomiting then intermittent abdominal cramps but no high fevers no bloody stools no wheezing difficulty breathing.  Review of Systems  Constitutional: Negative for activity change, fatigue and fever.  HENT: Negative for congestion and rhinorrhea.   Respiratory: Negative for cough and shortness of breath.   Cardiovascular: Negative for chest pain and leg swelling.  Gastrointestinal: Positive for abdominal pain, diarrhea, nausea and vomiting.  Genitourinary: Negative for dysuria and hematuria.  Neurological: Negative for weakness and headaches.  Psychiatric/Behavioral: Negative for confusion.       Objective:   Physical Exam  Constitutional: He appears well-developed and well-nourished. No distress.  HENT:  Head: Normocephalic and atraumatic.  Eyes: Right eye exhibits no discharge. Left eye exhibits no discharge.  Cardiovascular: Normal rate, regular rhythm and normal heart sounds.  No murmur heard. Pulmonary/Chest: Effort normal and breath sounds normal.  Abdominal: Soft. He exhibits no distension and no mass. There is tenderness. There is no guarding.  Neurological: He is alert.  Skin: Skin is warm and dry.  Psychiatric: He has a normal mood and affect. His behavior is normal.  Generalized abdominal tenderness        Assessment & Plan:  Severe viral gastroenteritis Should gradually get better Zofran to use as needed Imodium as needed Dicyclomine as needed Work excuse through tomorrow Warning signs discussed follow-up if problems Oral rehydration and bland diet recommended

## 2018-05-21 NOTE — Patient Instructions (Signed)

## 2018-09-19 ENCOUNTER — Ambulatory Visit (INDEPENDENT_AMBULATORY_CARE_PROVIDER_SITE_OTHER): Payer: BLUE CROSS/BLUE SHIELD | Admitting: *Deleted

## 2018-09-19 DIAGNOSIS — Z23 Encounter for immunization: Secondary | ICD-10-CM

## 2018-11-05 ENCOUNTER — Ambulatory Visit: Payer: BLUE CROSS/BLUE SHIELD | Admitting: Family Medicine

## 2018-11-05 ENCOUNTER — Encounter: Payer: Self-pay | Admitting: Family Medicine

## 2018-11-05 VITALS — Temp 98.3°F | Ht 68.0 in | Wt 143.0 lb

## 2018-11-05 DIAGNOSIS — J019 Acute sinusitis, unspecified: Secondary | ICD-10-CM | POA: Diagnosis not present

## 2018-11-05 DIAGNOSIS — B9689 Other specified bacterial agents as the cause of diseases classified elsewhere: Secondary | ICD-10-CM | POA: Diagnosis not present

## 2018-11-05 MED ORDER — AMOXICILLIN-POT CLAVULANATE 875-125 MG PO TABS: 1.0000 | ORAL_TABLET | Freq: Two times a day (BID) | ORAL | 0 refills | Status: DC

## 2018-11-05 NOTE — Progress Notes (Signed)
   Subjective:    Patient ID: Jacob Oconnor, male    DOB: 06/22/1971, 47 y.o.   MRN: [id number]  Fever   This is a new problem. The current episode started in the past 7 days. Associated symptoms include congestion, coughing, headaches, a sore throat and wheezing. Pertinent negatives include no chest pain, ear pain, nausea or vomiting. Treatments tried: otc meds.   Daughter and wife were also sick His illness began last week with more of a viral syndrome with head congestion drainage coughing feeling bad achy and low-grade fever over the weekend he developed left-sided sinus pain and discomfort  Review of Systems  Constitutional: Positive for fever. Negative for activity change and chills.  HENT: Positive for congestion, rhinorrhea and sore throat. Negative for ear pain.   Eyes: Negative for discharge.  Respiratory: Positive for cough and wheezing.   Cardiovascular: Negative for chest pain.  Gastrointestinal: Negative for nausea and vomiting.  Musculoskeletal: Negative for arthralgias.  Neurological: Positive for headaches.       Objective:   Physical Exam  Constitutional: He appears well-developed.  HENT:  Head: Normocephalic.  Mouth/Throat: Oropharynx is clear and moist. No oropharyngeal exudate.  Neck: Normal range of motion.  Cardiovascular: Normal rate, regular rhythm and normal heart sounds.  No murmur heard. Pulmonary/Chest: Effort normal and breath sounds normal. He has no wheezes.  Lymphadenopathy:    He has no cervical adenopathy.  Neurological: He exhibits normal muscle tone.  Skin: Skin is warm and dry.  Nursing note and vitals reviewed.  Tenderness of the sinuses on the left side  Work note given for Friday Saturday Sunday and Monday     Assessment & Plan:  Acute bacterial sinusitis as result of recent virus should gradually get better with medication warning signs were discussed in detail  Follow-up or call if ongoing troubles

## 2019-01-31 ENCOUNTER — Encounter: Payer: Self-pay | Admitting: Family Medicine

## 2019-01-31 ENCOUNTER — Ambulatory Visit: Payer: BLUE CROSS/BLUE SHIELD | Admitting: Family Medicine

## 2019-01-31 VITALS — BP 118/80 | Temp 98.1°F | Ht 68.0 in | Wt 146.0 lb

## 2019-01-31 DIAGNOSIS — J019 Acute sinusitis, unspecified: Secondary | ICD-10-CM | POA: Diagnosis not present

## 2019-01-31 MED ORDER — AMOXICILLIN-POT CLAVULANATE 875-125 MG PO TABS: 1.0000 | ORAL_TABLET | Freq: Two times a day (BID) | ORAL | 0 refills | Status: DC

## 2019-01-31 NOTE — Progress Notes (Signed)
   Subjective:    Patient ID: Jacob Oconnor, male    DOB: 1971-11-11, 48 y.o.   MRN: [id number]  HPI  Patient is here today to with complaints of a runny nose, headache, fever - 100 at home, irritated left eye and left side of nose, some sinus drainage. No sore throat. No cough. Sinus pain to left frontal, maxillary and behind left eye.   Symptoms ongoing since Tuesday morning.  Has been taking Tylenol.   Review of Systems  Constitutional: Positive for fever.  HENT: Positive for congestion, postnasal drip, sinus pressure and sinus pain. Negative for ear discharge, ear pain and sore throat.   Eyes: Negative for discharge.  Respiratory: Negative for cough, shortness of breath and wheezing.   Gastrointestinal: Negative for diarrhea, nausea and vomiting.       Objective:   Physical Exam Vitals signs and nursing note reviewed.  Constitutional:      General: He is not in acute distress.    Appearance: Normal appearance. He is not toxic-appearing.  HENT:     Head: Normocephalic and atraumatic.     Right Ear: Tympanic membrane is retracted.     Left Ear: Tympanic membrane is retracted.     Nose: Congestion present.     Right Sinus: No maxillary sinus tenderness or frontal sinus tenderness.     Left Sinus: Maxillary sinus tenderness and frontal sinus tenderness present.     Mouth/Throat:     Mouth: Mucous membranes are moist.     Pharynx: Oropharynx is clear.  Eyes:     General:        Right eye: No discharge.        Left eye: No discharge.  Neck:     Musculoskeletal: Neck supple. No neck rigidity.  Cardiovascular:     Rate and Rhythm: Normal rate and regular rhythm.     Heart sounds: Normal heart sounds.  Pulmonary:     Effort: Pulmonary effort is normal. No respiratory distress.     Breath sounds: Normal breath sounds. No wheezing or rales.  Lymphadenopathy:     Cervical: No cervical adenopathy.  Skin:    General: Skin is warm and dry.  Neurological:     Mental Status: He  is alert and oriented to person, place, and time.  Psychiatric:        Behavior: Behavior normal.       Assessment & Plan:  Acute rhinosinusitis  Discussed sinusitis likely, will treat given significant sinus tenderness on exam. Pt with hx of frequent sinus infections and problems with allergies. Will treat with augmentin as this has worked well for him in the past. Symptomatic care discussed, warning signs discussed. F/u if symptoms worsen or fail to improve.

## 2019-02-01 ENCOUNTER — Encounter: Payer: Self-pay | Admitting: Family Medicine

## 2019-03-01 ENCOUNTER — Telehealth: Payer: Self-pay | Admitting: Family Medicine

## 2019-03-01 MED ORDER — ALBUTEROL SULFATE HFA 108 (90 BASE) MCG/ACT IN AERS: 2.0000 | INHALATION_SPRAY | Freq: Four times a day (QID) | RESPIRATORY_TRACT | 0 refills | Status: DC | PRN

## 2019-03-01 NOTE — Telephone Encounter (Signed)
Patient needs new prescription for albuterol inhalers called into [facility]

## 2019-03-01 NOTE — Telephone Encounter (Signed)
Ok, two sprays qid prn wheeze

## 2019-03-01 NOTE — Telephone Encounter (Signed)
Prescription sent electronically to pharmacy. Patient notified. 

## 2019-03-01 NOTE — Telephone Encounter (Signed)
Seen 01/31/19 for sick visit

## 2019-03-05 ENCOUNTER — Other Ambulatory Visit: Payer: Self-pay | Admitting: *Deleted

## 2019-03-05 ENCOUNTER — Telehealth: Payer: Self-pay | Admitting: Family Medicine

## 2019-03-05 MED ORDER — ALBUTEROL SULFATE HFA 108 (90 BASE) MCG/ACT IN AERS: 2.0000 | INHALATION_SPRAY | Freq: Four times a day (QID) | RESPIRATORY_TRACT | 5 refills | Status: DC | PRN

## 2019-03-05 MED ORDER — FLUTICASONE PROPIONATE HFA 44 MCG/ACT IN AERO: 2.0000 | INHALATION_SPRAY | Freq: Two times a day (BID) | RESPIRATORY_TRACT | 5 refills | Status: DC

## 2019-03-05 NOTE — Telephone Encounter (Signed)
Discussed with pt. Pt verbalized understanding and meds sent to pharm  

## 2019-03-05 NOTE — Telephone Encounter (Signed)
Pt is needing a Flovent inhaler called in for wheezing and coughing. Pt is not short of breath and does not have a fever.

## 2019-03-05 NOTE — Telephone Encounter (Signed)
Pt states he saw dr Lorin Picket in [place] last night and he was told to call office to get a every day inhaler like flovent. Was seen 2/13 for sinusitis and having to use his rescue inhaler about twice a day. No fever.   [facility] [place]

## 2019-03-05 NOTE — Telephone Encounter (Signed)
Please talk with Jacob Oconnor In order to control his asthma we can do the following for him Albuterol inhaler 2 puffs every 4 hours as needed 1 inhaler with 5 refills Also recommend adding low-dose Flovent 44 mcg 2 puffs twice daily, 1 inhaler with 5 refills He should see that he should get significant improvement over the course of the next few weeks if ongoing troubles let us know

## 2019-09-23 ENCOUNTER — Other Ambulatory Visit: Payer: Self-pay

## 2019-09-23 ENCOUNTER — Telehealth: Payer: Self-pay | Admitting: Family Medicine

## 2019-09-23 ENCOUNTER — Ambulatory Visit (INDEPENDENT_AMBULATORY_CARE_PROVIDER_SITE_OTHER): Payer: BC Managed Care – PPO | Admitting: Family Medicine

## 2019-09-23 ENCOUNTER — Encounter: Payer: Self-pay | Admitting: Family Medicine

## 2019-09-23 DIAGNOSIS — Z20822 Contact with and (suspected) exposure to covid-19: Secondary | ICD-10-CM

## 2019-09-23 DIAGNOSIS — R05 Cough: Secondary | ICD-10-CM

## 2019-09-23 DIAGNOSIS — Z20828 Contact with and (suspected) exposure to other viral communicable diseases: Secondary | ICD-10-CM

## 2019-09-23 DIAGNOSIS — R509 Fever, unspecified: Secondary | ICD-10-CM

## 2019-09-23 NOTE — Progress Notes (Signed)
   Subjective:    Patient ID: Jacob Oconnor, male    DOB: 10/04/1971, 48 y.o.   MRN: [id number]  HPIpt states there have been a few people at his job that have been tested for covid. He has been having fever, chest pain,congestion, ear ache, diarrhea, and headache. Fever is gone now. Taking aspirin and increasing fluids.  Patient started off with diarrhea then he started having some chest congestion and occasional chest pain but no shortness of breath he also has related some coughing he denies high fevers or chills currently but did have fever and chills his energy level is subpar still having some diarrhea able to eat and drink Virtual Visit via Video Note  I connected with Jacob Oconnor on 09/23/19 at  3:00 PM EDT by a video enabled telemedicine application and verified that I am speaking with the correct person using two identifiers.  Location: Patient: home Provider: office   I discussed the limitations of evaluation and management by telemedicine and the availability of in person appointments. The patient expressed understanding and agreed to proceed.  History of Present Illness:    Observations/Objective:   Assessment and Plan:   Follow Up Instructions:    I discussed the assessment and treatment plan with the patient. The patient was provided an opportunity to ask questions and all were answered. The patient agreed with the plan and demonstrated an understanding of the instructions.   The patient was advised to call back or seek an in-person evaluation if the symptoms worsen or if the condition fails to improve as anticipated.  I provided 15 minutes of non-face-to-face time during this encounter.        Review of Systems  Constitutional: Negative for activity change, chills and fever.  HENT: Positive for congestion and rhinorrhea. Negative for ear pain.   Eyes: Negative for discharge.  Respiratory: Positive for cough. Negative for wheezing.   Cardiovascular:  Negative for chest pain.  Gastrointestinal: Negative for nausea and vomiting.  Musculoskeletal: Negative for arthralgias.       Objective:   Physical Exam  Patient had virtual visit Appears to be in no distress Atraumatic Neuro able to relate and oriented No apparent resp distress Color normal       Assessment & Plan:  Suspected COVID This patient should stay at home the rest of this week.  Home isolation from this past Thursday through this coming Sunday with return to work on October 12 as long as his symptoms allow for it Warning signs regarding symptomatology that points toward the need to be in evaluated in the ER regarding respiratory or GI or passing out was discussed in detail

## 2019-09-24 ENCOUNTER — Encounter: Payer: Self-pay | Admitting: Family Medicine

## 2019-09-24 ENCOUNTER — Other Ambulatory Visit: Payer: Self-pay | Admitting: *Deleted

## 2019-09-24 DIAGNOSIS — Z20822 Contact with and (suspected) exposure to covid-19: Secondary | ICD-10-CM

## 2019-09-26 ENCOUNTER — Encounter: Payer: Self-pay | Admitting: Family Medicine

## 2019-09-26 ENCOUNTER — Other Ambulatory Visit: Payer: Self-pay | Admitting: Family Medicine

## 2019-09-26 LAB — NOVEL CORONAVIRUS, NAA: SARS-CoV-2, NAA: NOT DETECTED

## 2019-09-26 NOTE — Telephone Encounter (Signed)
Staff Please go ahead and fax a letter as requested  Please also inform the patient that so far the test result is still pending We will be following for the results

## 2019-09-27 NOTE — Telephone Encounter (Signed)
Nurses His COVID test is negative It is possible that this could be a false negative I still recommend for him to be out for the length of time we discussed If not significantly improved by Monday to notify us

## 2019-10-15 NOTE — Telephone Encounter (Signed)
Error please close

## 2020-01-27 ENCOUNTER — Other Ambulatory Visit: Payer: Self-pay

## 2020-01-27 ENCOUNTER — Encounter: Payer: Self-pay | Admitting: Family Medicine

## 2020-01-27 ENCOUNTER — Telehealth: Payer: Self-pay | Admitting: Family Medicine

## 2020-01-27 ENCOUNTER — Ambulatory Visit: Payer: Managed Care, Other (non HMO) | Attending: Internal Medicine

## 2020-01-27 DIAGNOSIS — Z20822 Contact with and (suspected) exposure to covid-19: Secondary | ICD-10-CM | POA: Diagnosis not present

## 2020-01-27 NOTE — Telephone Encounter (Signed)
Patient will need a work note from February 7 May return to work February 22 Under self quarantine due to family member with Covid  Please do work note including information that he is under Haematologist for Covid exposure With return to work February 22 Please mail this to the patient thank you

## 2020-01-28 ENCOUNTER — Ambulatory Visit (INDEPENDENT_AMBULATORY_CARE_PROVIDER_SITE_OTHER): Payer: BC Managed Care – PPO | Admitting: Family Medicine

## 2020-01-28 ENCOUNTER — Encounter: Payer: Self-pay | Admitting: Family Medicine

## 2020-01-28 ENCOUNTER — Telehealth: Payer: Self-pay | Admitting: *Deleted

## 2020-01-28 DIAGNOSIS — U071 COVID-19: Secondary | ICD-10-CM | POA: Diagnosis not present

## 2020-01-28 LAB — NOVEL CORONAVIRUS, NAA: SARS-CoV-2, NAA: NOT DETECTED

## 2020-01-28 MED ORDER — ALBUTEROL SULFATE HFA 108 (90 BASE) MCG/ACT IN AERS: 2.0000 | INHALATION_SPRAY | RESPIRATORY_TRACT | 1 refills | Status: AC | PRN

## 2020-01-28 NOTE — Telephone Encounter (Signed)
Patient scheduled virtual visit today with Dr Scott 

## 2020-01-28 NOTE — Progress Notes (Signed)
   Subjective:    Patient ID: Jacob Oconnor, male    DOB: 10/22/1971, 49 y.o.   MRN: [id number]  Cough This is a new problem. The current episode started in the past 7 days. Associated symptoms include headaches, nasal congestion, rhinorrhea and a sore throat. Pertinent negatives include no chest pain, chills, ear pain, fever or wheezing. Associated symptoms comments: diarrhea.   Wife and daughter tested positive for Covid- his test was negative Patient relates he had a negative test States he is having significant troubles with head congestion drainage diarrhea not feeling good some chest tightness denies wheezing or difficulty breathing  Review of Systems  Constitutional: Positive for fatigue. Negative for activity change, chills and fever.  HENT: Positive for congestion, rhinorrhea and sore throat. Negative for ear pain.   Eyes: Negative for discharge.  Respiratory: Positive for cough. Negative for wheezing.   Cardiovascular: Negative for chest pain.  Gastrointestinal: Positive for diarrhea. Negative for nausea and vomiting.  Musculoskeletal: Negative for arthralgias.  Neurological: Positive for headaches.   Virtual Visit via Video Note  I connected with Jacob Oconnor on 01/28/20 at  4:10 PM EST by a video enabled telemedicine application and verified that I am speaking with the correct person using two identifiers.  Location: Patient: home Provider: office   I discussed the limitations of evaluation and management by telemedicine and the availability of in person appointments. The patient expressed understanding and agreed to proceed.  History of Present Illness:    Observations/Objective:   Assessment and Plan:   Follow Up Instructions:    I discussed the assessment and treatment plan with the patient. The patient was provided an opportunity to ask questions and all were answered. The patient agreed with the plan and demonstrated an understanding of the instructions.     The patient was advised to call back or seek an in-person evaluation if the symptoms worsen or if the condition fails to improve as anticipated.  I provided 18 minutes of non-face-to-face time during this encounter.        Objective:   Physical Exam   Patient had virtual visit Appears to be in no distress Atraumatic Neuro able to relate and oriented No apparent resp distress Color normal      Assessment & Plan:  Covid infection Warning signs discussed Supplements including vitamin D vitamin C and zinc Albuterol 2 puffs every 4 hours as needed Self-isolation for 10 days Work excuse sent To call us back if progressive symptoms or shortness of breath

## 2020-01-28 NOTE — Telephone Encounter (Signed)
Pt called for result of COVID test obtained 01/27/20; explained result is not available, and the turn around time is based on the number of tests to be completed; explained to pt that results are available first in MyChart; pt informed he will be called regarding his result; pt encouraged to answer all calls; he verbalized understanding.

## 2020-01-28 NOTE — Telephone Encounter (Signed)
Nurses Please connect with Jacob Oconnor With his symptomatology I would state that he is likely to be infected with Covid I believe that his test was a false negative Given his symptomatology I would recommend that he consider doing a virtual visit with [place] Also it would be perfectly fine to give him a work note it is recommended for him to be out of work at least 10 days from the first sign of getting sick

## 2020-01-30 ENCOUNTER — Other Ambulatory Visit: Payer: Self-pay

## 2020-01-30 ENCOUNTER — Ambulatory Visit: Payer: Managed Care, Other (non HMO) | Attending: Internal Medicine

## 2020-01-30 DIAGNOSIS — Z20822 Contact with and (suspected) exposure to covid-19: Secondary | ICD-10-CM

## 2020-02-01 LAB — NOVEL CORONAVIRUS, NAA: SARS-CoV-2, NAA: NOT DETECTED

## 2020-02-03 ENCOUNTER — Encounter: Payer: Self-pay | Admitting: Family Medicine

## 2020-02-03 NOTE — Telephone Encounter (Signed)
Nurses  Please connect with Jacob Oconnor  I certainly understand his situation.  I do believe he had Covid I just believe he was one of the numbers of individuals who get sick we do not test positive.  I would recommend that he may return to work 10 days after the first symptoms began Please find out what day his symptoms first began Then I believe it would be safe for him to return to work 10 days from the date of that first symptom of illness. (For instance if his first day of symptoms was the seventh then he can return to work on the 17th) If he needs a note please provide this. If he is having any trouble otherwise please let me know If he needs anything specific for myself please let me know as well  Thanks-Dr. Lorin Picket

## 2020-12-14 ENCOUNTER — Telehealth: Payer: Self-pay

## 2020-12-14 NOTE — Telephone Encounter (Signed)
Pt will not make appt with front he does not want to disclose what is going on he said it is imperative that a nurse call he immediately   Pt call back [phone]

## 2020-12-14 NOTE — Telephone Encounter (Signed)
Pt contacted and states that he is losing weight and not sleeping. Pt going back and forth to [facility] due to hand injury. Pt has a lot of things going on in personal life at this time. Pt also states that he is worried about finances. Pt was written up at work on 12/11/20 for 3rd time and the next step is termination. Pt states he has never been in trouble at work or anything but since hand injury he has been in trouble. Pt states he does feel safe and no thoughts of self harm. Pt transferred up front to be put on provider schedule Wednesday.

## 2020-12-14 NOTE — Telephone Encounter (Signed)
We will see patient on Wednesday, GAD as well as PHQ would be appropriate when he comes

## 2020-12-16 ENCOUNTER — Other Ambulatory Visit: Payer: Self-pay

## 2020-12-16 ENCOUNTER — Ambulatory Visit: Payer: BC Managed Care – PPO | Admitting: Family Medicine

## 2020-12-16 VITALS — BP 126/82 | Temp 97.2°F | Ht 68.0 in | Wt 147.8 lb

## 2020-12-16 DIAGNOSIS — F32A Depression, unspecified: Secondary | ICD-10-CM

## 2020-12-16 DIAGNOSIS — F419 Anxiety disorder, unspecified: Secondary | ICD-10-CM | POA: Diagnosis not present

## 2020-12-16 DIAGNOSIS — F322 Major depressive disorder, single episode, severe without psychotic features: Secondary | ICD-10-CM

## 2020-12-16 MED ORDER — DULOXETINE HCL 30 MG PO CPEP: 30.0000 mg | ORAL_CAPSULE | Freq: Every day | ORAL | 3 refills | Status: DC

## 2020-12-16 NOTE — Progress Notes (Signed)
° °  Subjective:    Patient ID: Jacob Oconnor, male    DOB: 1971/07/28, 49 y.o.   MRN: [id number]  HPI Patient arrives to discuss depression. 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  Denies any substance abuse issues. Review of Systems Please see above    Objective:   Physical Exam        Assessment & Plan:  Major depression with anxiety It would be highly recommended to go ahead and start medication to help with the depression as well as to help with the neuropathic pain of his hand I believe Cymbalta is a good choice 30 mg daily.  I would recommend starting this with the patient to do a follow-up in 3 to 4 weeks I have recommended for the patient to do some counseling and he is also considering taking a leave of absence from his work in order to focus on getting better with the depression and improving his overall health he will let us know regarding this.  Counseling highly recommended but it could take several weeks for that to kick in

## 2020-12-16 NOTE — Patient Instructions (Signed)
Start with 30 mg Cymbalta daily  Please send me an up date in 2 weeks after your visit with the pain management doctor.      Major Depressive Disorder, Adult Major depressive disorder (MDD) is a mental health condition. MDD often makes you feel sad, hopeless, or helpless. MDD can also cause symptoms in your body. MDD can affect your:  Work.  School.  Relationships.  Other normal activities. MDD can range from mild to very bad. It may occur once (single episode MDD). It can also occur many times (recurrent MDD). The main symptoms of MDD often include:  Feeling sad, depressed, or irritable most of the time.  Loss of interest. MDD symptoms also include:  Sleeping too much or too little.  Eating too much or too little.  A change in your weight.  Feeling tired (fatigue) or having low energy.  Feeling worthless.  Feeling guilty.  Trouble making decisions.  Trouble thinking clearly.  Thoughts of suicide or harming others.  Feeling weak.  Feeling agitated.  Keeping yourself from being around other people (isolation). Follow these instructions at home: Activity  Do these things as told by your doctor: ? Go back to your normal activities. ? Exercise regularly. ? Spend time outdoors. Alcohol  Talk with your doctor about how alcohol can affect your antidepressant medicines.  Do not drink alcohol. Or, limit how much alcohol you drink. ? This means no more than 1 drink a day for nonpregnant women and 2 drinks a day for men. One drink equals one of these:  12 oz of beer.  5 oz of wine.  1 oz of hard liquor. General instructions  Take over-the-counter and prescription medicines only as told by your doctor.  Eat a healthy diet.  Get plenty of sleep.  Find activities that you enjoy. Make time to do them.  Think about joining a support group. Your doctor may be able to suggest a group for you.  Keep all follow-up visits as told by your doctor. This is  important. Where to find more information:  [employer] on Mental Illness: ? www.nami.org  [place] [employer] of Mental Health: ? http://www.maynard.net/  National Suicide Prevention Lifeline: ? 314 412 0809. This is free, 24-hour help. Contact a doctor if:  Your symptoms get worse.  You have new symptoms. Get help right away if:  You self-harm.  You see, hear, taste, smell, or feel things that are not present (hallucinate). If you ever feel like you may hurt yourself or others, or have thoughts about taking your own life, get help right away. You can go to your nearest emergency department or call:  Your local emergency services (911 in the [place]  A suicide crisis helpline, such as the National Suicide Prevention Lifeline: ? (865)724-3253. This is open 24 hours a day. This information is not intended to replace advice given to you by your health care provider. Make sure you discuss any questions you have with your health care provider. Document Revised: 11/17/2017 Document Reviewed: 08/21/2016 Elsevier Patient Education  2020 [employer].

## 2020-12-17 ENCOUNTER — Telehealth: Payer: Self-pay | Admitting: Family Medicine

## 2020-12-17 ENCOUNTER — Encounter: Payer: Self-pay | Admitting: Family Medicine

## 2020-12-17 NOTE — Telephone Encounter (Signed)
Pt requesting note for leave of absence. Pt was seen yesterday. Pt would like note for leave of absence from yesterday to next schedule visit (or at least 30 days). Pt states he is also filing for FMLA. Pt states provider instructed him to let us know regarding letter. Pt would like the note to go to my chart since we are closed tomorrow. Please advise. Thank you

## 2020-12-17 NOTE — Telephone Encounter (Signed)
Letter typed up by front and set to pt my chart. Pt contacted and informed that a generic letter was typed up. Once we get FMLA paper we will have to put depression and all other ailment on that and pt verbalized understanding. Pt very thankful

## 2020-12-19 NOTE — Telephone Encounter (Signed)
Nurses Appreciate letter being done as directed Please be watching for FMLA form and forward to me when it is available thank you

## 2020-12-28 ENCOUNTER — Telehealth: Payer: Self-pay | Admitting: Family Medicine

## 2020-12-28 NOTE — Telephone Encounter (Signed)
Patient 's disability forms just came over in your box to complete.

## 2020-12-29 ENCOUNTER — Ambulatory Visit (INDEPENDENT_AMBULATORY_CARE_PROVIDER_SITE_OTHER): Payer: No Typology Code available for payment source | Admitting: Psychologist

## 2020-12-29 DIAGNOSIS — F321 Major depressive disorder, single episode, moderate: Secondary | ICD-10-CM | POA: Diagnosis not present

## 2020-12-29 DIAGNOSIS — F411 Generalized anxiety disorder: Secondary | ICD-10-CM

## 2020-12-30 NOTE — Telephone Encounter (Signed)
I filled in the portion that I could I highlighted several spots that need the patient's input Please connect with the patient family and the appropriate parts then he may have these FMLA or fax what ever he prefers  He will need to do a follow-up visit toward the end of this month either in person or it would be perfectly fine to do virtual which would probably be safer than being in the office during this time of omicron

## 2020-12-31 ENCOUNTER — Encounter: Payer: Self-pay | Admitting: Family Medicine

## 2020-12-31 NOTE — Telephone Encounter (Signed)
Patient is needing paper done as soon as possible so I put him in for phone visit on 1/18 at 9:00 .

## 2021-01-04 ENCOUNTER — Encounter: Payer: Self-pay | Admitting: Family Medicine

## 2021-01-05 ENCOUNTER — Telehealth: Payer: BC Managed Care – PPO | Admitting: Family Medicine

## 2021-01-06 ENCOUNTER — Ambulatory Visit (INDEPENDENT_AMBULATORY_CARE_PROVIDER_SITE_OTHER): Payer: No Typology Code available for payment source | Admitting: Psychologist

## 2021-01-06 DIAGNOSIS — F321 Major depressive disorder, single episode, moderate: Secondary | ICD-10-CM | POA: Diagnosis not present

## 2021-01-06 DIAGNOSIS — F411 Generalized anxiety disorder: Secondary | ICD-10-CM | POA: Diagnosis not present

## 2021-01-06 NOTE — Telephone Encounter (Signed)
Papers were completed as requested

## 2021-01-08 ENCOUNTER — Other Ambulatory Visit: Payer: Self-pay | Admitting: Family Medicine

## 2021-01-11 NOTE — Telephone Encounter (Signed)
The follow-up to be either toward the end of this week or the start of next week please work with the patient to schedule  I am flexible We can see him in the office or virtual

## 2021-01-12 ENCOUNTER — Other Ambulatory Visit: Payer: Self-pay

## 2021-01-12 ENCOUNTER — Ambulatory Visit: Payer: BC Managed Care – PPO | Admitting: Family Medicine

## 2021-01-12 VITALS — BP 122/72 | Temp 97.7°F | Ht 68.0 in | Wt 149.6 lb

## 2021-01-12 DIAGNOSIS — F322 Major depressive disorder, single episode, severe without psychotic features: Secondary | ICD-10-CM

## 2021-01-12 MED ORDER — DULOXETINE HCL 60 MG PO CPEP: 60.0000 mg | ORAL_CAPSULE | Freq: Every day | ORAL | 0 refills | Status: DC

## 2021-01-12 NOTE — Progress Notes (Signed)
   Subjective:    Patient ID: Jacob Oconnor, male    DOB: 1971/12/17, 50 y.o.   MRN: [id number]  HPI  Patient arrives for a follow up on depression and anxiety. Severe depression Not suicidal Feels rundown because of what is going on Has chronic pain in his hand as well Saw pain specialist with the hand earlier today they are recommending a medication he is uncertain of the name but he has it written down at home he will send [place] the name of this He states his Cymbalta has helped some with his symptoms but he still suffering with feeling down sad blue and feeling very stressed about his current situation with his hand as well as with the effects it has had on his life in regards to unable to do his regular job unable to do housework and yard work and help with his young child He states the counseling is helping but finds himself feeling very stressed  Review of Systems    Please see above Objective:   Physical Exam Lungs clear respiratory rate normal heart regular       Assessment & Plan:  Major depression severe Not suicidal Increase the dose of Cymbalta to 60 mg Patient is not able at this point in time to return to work I would recommend extending his work excuse through March 5 With his severe depression he is not able to process and coordinate work task appropriately and therefore he needs to continue intensive counseling and higher dose of medication If he does not respond to this then potentially change medication and potentially consult with psychiatry I also recommend doing a follow-up visit with him in 3 weeks - 4 weeks Continue the counseling If he should get worse he is to notify us A copy of the consult with his hand specialist will be sent to [place] The patient will inform us of his counselor's name so we can keep him in the loop In 4 weeks if we are not seeing a significant improvement with his depression then potential change in medication and psychiatry consult

## 2021-01-14 ENCOUNTER — Telehealth: Payer: Self-pay | Admitting: Family Medicine

## 2021-01-14 NOTE — Telephone Encounter (Signed)
Patient had [employer] send over a form to be completed again just the front page needs to be completed where the highlighted areas are.I will print records when form comes back upfront. In your box.

## 2021-01-15 NOTE — Telephone Encounter (Signed)
Front staff  Please send the note from January 12, 2021 as well as the medical information form from [place] this afternoon  Please send Alp a MyChart message letting him know that this has been completed  The fax number is on the form  Hi Hektor-  We have finished filling out that form we will fax over that information this afternoon with a estimated return to work date February 20, 2021  Please continue your medications as prescribed Please continue your counseling sessions with the therapist Please notify us if having any particular problems or issues And also please keep your follow-up visit for February 09, 2021  We can see you sooner if need be feel free to let us know  Thanks-Dr. Lorin Picket

## 2021-01-17 NOTE — Telephone Encounter (Signed)
This was completed and forwarded to the front who would take care of this from there

## 2021-01-20 ENCOUNTER — Ambulatory Visit (INDEPENDENT_AMBULATORY_CARE_PROVIDER_SITE_OTHER): Payer: No Typology Code available for payment source | Admitting: Psychologist

## 2021-01-20 DIAGNOSIS — F321 Major depressive disorder, single episode, moderate: Secondary | ICD-10-CM

## 2021-01-20 DIAGNOSIS — F411 Generalized anxiety disorder: Secondary | ICD-10-CM | POA: Diagnosis not present

## 2021-01-26 ENCOUNTER — Encounter: Payer: Self-pay | Admitting: Family Medicine

## 2021-02-03 ENCOUNTER — Ambulatory Visit: Payer: BC Managed Care – PPO | Admitting: Psychologist

## 2021-02-06 ENCOUNTER — Encounter: Payer: Self-pay | Admitting: Family Medicine

## 2021-02-08 ENCOUNTER — Encounter: Payer: Self-pay | Admitting: Family Medicine

## 2021-02-08 DIAGNOSIS — G90511 Complex regional pain syndrome I of right upper limb: Secondary | ICD-10-CM

## 2021-02-08 HISTORY — DX: Complex regional pain syndrome i of right upper limb: G90.511

## 2021-02-08 NOTE — Telephone Encounter (Signed)
Patient has appt today at 11:00am with Dr Lorin Picket

## 2021-02-09 ENCOUNTER — Other Ambulatory Visit: Payer: Self-pay

## 2021-02-09 ENCOUNTER — Ambulatory Visit: Payer: BC Managed Care – PPO | Admitting: Family Medicine

## 2021-02-09 ENCOUNTER — Encounter: Payer: Self-pay | Admitting: Family Medicine

## 2021-02-09 VITALS — BP 107/74 | HR 100 | Temp 97.9°F | Wt 152.8 lb

## 2021-02-09 DIAGNOSIS — F322 Major depressive disorder, single episode, severe without psychotic features: Secondary | ICD-10-CM | POA: Diagnosis not present

## 2021-02-09 DIAGNOSIS — G90511 Complex regional pain syndrome I of right upper limb: Secondary | ICD-10-CM | POA: Diagnosis not present

## 2021-02-09 MED ORDER — DULOXETINE HCL 60 MG PO CPEP: 60.0000 mg | ORAL_CAPSULE | Freq: Every day | ORAL | 1 refills | Status: DC

## 2021-02-09 NOTE — Progress Notes (Signed)
Subjective:    Patient ID: Jacob Oconnor, male    DOB: 07-01-1971, 50 y.o.   MRN: [id number]  Depression      (Trouble sleeping)  This is a chronic problem.( Trouble sleeping)     The symptoms are aggravated by family issues.  Past medical history includes anxiety.   Anxiety Presents for follow-up visit. Primary symptoms comment: trouble sleeping. The quality of sleep is poor. Nighttime awakenings: several.   Compliance with medications is 76-100%.  Pt is continuing with therapy also.  Getting nerve blocker on March 8 from pain management.  Patient was seen by the specialist They felt that he has complex regional pain syndrome They are talking about nerve ganglion block They are also talked about doing some physical therapy for him He states the pain is severe to the point where he cannot do much activity with his hand cannot do dexterity cannot do physical labor around the house  Patient states he finds himself feeling anxious nervous depressed he is slightly better than what he was.  Finds himself worried constantly regarding his financial situation, whether or not he will be able to work his job Also whether or not he will have his job Also concerned about his family's health and also troubled by world events He states he has a hard time focusing hard time following through on things.  Review of Systems  Psychiatric/Behavioral: Positive for depression.       Objective:   Physical Exam He has a blunted affect.  Does make eye contact.  Is able to converse.  Lungs are clear respiratory rate normal heart regular GAD 7 : Generalized Anxiety Score 02/09/2021 12/16/2020  Nervous, Anxious, on Edge 3 3  Control/stop worrying 3 3  Worry too much - different things 3 3  Trouble relaxing 2 3  Restless 2 1  Easily annoyed or irritable 2 2  Afraid - awful might happen 3 3  Total GAD 7 Score 18 18  Anxiety Difficulty Very difficult Extremely difficult   Flowsheet Row Office Visit from  02/09/2021 in [facility]  PHQ-9 Total Score 20     Depression screen [facility] 2/9 02/09/2021 01/12/2021 12/19/2020 12/16/2020  Decreased Interest 2 1 3 3   Down, Depressed, Hopeless 3 3 3 3   PHQ - 2 Score 5 4 6 6   Altered sleeping 3 3 3 3   Tired, decreased energy 2 3 3 3   Change in appetite 3 3 3 3   Feeling bad or failure about yourself  2 3 3 3   Trouble concentrating 2 3 3 3   Moving slowly or fidgety/restless 3 3 1 1   Suicidal thoughts 0 0 0 0  PHQ-9 Score 20 22 22 22   Difficult doing work/chores Very difficult Extremely dIfficult Extremely dIfficult Extremely dIfficult    Very nice gentleman I believe he is legitimately depressed relating to his condition.  Before this injury he was highly functioning highly productive person who now is suffering with severe ongoing pain and disability.  This is triggered depression with him.  There are other factors compounding it as listed above.  We will be connecting with his psychologist.      Assessment & Plan:  Major depression Continue counseling on a regular basis Continue current medication Touch base with counselor more than likely will help set up for further opinion with psychiatry Was given work note to go through April 4 Currently with the severity of his depression I do not believe patient can function appropriately  at work. Whether or not to return to work in early April will be added decision also related around his counselor as well as psychiatry. Recheck in 4 weeks Currently also under the care of Workmen's Comp. for his hand injury

## 2021-02-16 ENCOUNTER — Ambulatory Visit (INDEPENDENT_AMBULATORY_CARE_PROVIDER_SITE_OTHER): Payer: No Typology Code available for payment source | Admitting: Psychologist

## 2021-02-16 DIAGNOSIS — F411 Generalized anxiety disorder: Secondary | ICD-10-CM

## 2021-02-16 DIAGNOSIS — F321 Major depressive disorder, single episode, moderate: Secondary | ICD-10-CM | POA: Diagnosis not present

## 2021-03-03 ENCOUNTER — Ambulatory Visit (INDEPENDENT_AMBULATORY_CARE_PROVIDER_SITE_OTHER): Payer: No Typology Code available for payment source | Admitting: Psychologist

## 2021-03-03 DIAGNOSIS — F321 Major depressive disorder, single episode, moderate: Secondary | ICD-10-CM

## 2021-03-03 DIAGNOSIS — F411 Generalized anxiety disorder: Secondary | ICD-10-CM

## 2021-03-10 ENCOUNTER — Telehealth: Payer: Self-pay | Admitting: Family Medicine

## 2021-03-10 ENCOUNTER — Encounter: Payer: Self-pay | Admitting: Family Medicine

## 2021-03-10 NOTE — Telephone Encounter (Signed)
Tried to call Zaviyar and no answer

## 2021-03-10 NOTE — Telephone Encounter (Signed)
Patient stated he will send Dr Lorin Picket a my chart message with all the information he will need to contact the counselor.

## 2021-03-10 NOTE — Telephone Encounter (Signed)
Some noted thank you °

## 2021-03-10 NOTE — Telephone Encounter (Signed)
Pt has form from [employer]. On fax provider has "so talk with Cochise to get name and number of his counselor. counselor is with [facility]. That counselor wont speak with me without release but I just need him to call and I will do the talking".   For clarification: Do we need to call Onalee Hua or his therapist? Please clarify. Thank you!

## 2021-03-10 NOTE — Telephone Encounter (Signed)
Please see if we can figure out the name of his counselor you may need to speak with Jakaden to get that.  When you call the counselors number please asked that the counselor call me on my cell number. (I realize often the psychology counselors will not speak about particulars but at least they can listen to what I have to say about what is going on with Onalee Hua thank you)

## 2021-03-11 NOTE — Telephone Encounter (Signed)
Nurses  #1 as for the redness in the hand it would be wise for the patient to check base with the individuals that did the injection to make sure that this is not a sign of developing infection  #2 as for information regarding his daughter please copy and paste information regarding his daughter into a phone message so that we can properly document this under her chart not Davids thank you

## 2021-03-11 NOTE — Telephone Encounter (Signed)
Composed phone message in patient chart 

## 2021-03-14 ENCOUNTER — Telehealth: Payer: Self-pay | Admitting: Family Medicine

## 2021-03-14 NOTE — Telephone Encounter (Signed)
Information was sent to his counselor regarding his disability.  We are awaiting his reply.  Hilbert Corrigan psychologist with [facility] psychology

## 2021-03-16 ENCOUNTER — Encounter: Payer: Self-pay | Admitting: Family Medicine

## 2021-03-16 ENCOUNTER — Ambulatory Visit: Payer: BC Managed Care – PPO | Admitting: Family Medicine

## 2021-03-16 ENCOUNTER — Other Ambulatory Visit: Payer: Self-pay

## 2021-03-16 VITALS — BP 124/76 | HR 78 | Temp 98.6°F | Ht 68.0 in | Wt 158.0 lb

## 2021-03-16 DIAGNOSIS — F419 Anxiety disorder, unspecified: Secondary | ICD-10-CM | POA: Diagnosis not present

## 2021-03-16 DIAGNOSIS — F32A Depression, unspecified: Secondary | ICD-10-CM

## 2021-03-16 MED ORDER — TRIAMCINOLONE ACETONIDE 0.1 % EX CREA: TOPICAL_CREAM | CUTANEOUS | 4 refills | Status: DC

## 2021-03-16 NOTE — Progress Notes (Signed)
   Subjective:    Patient ID: Jacob Oconnor, male    DOB: 1971-07-11, 50 y.o.   MRN: [id number]  HPI follow up on anxiety and depression.  Very nice patient Suffering a lot with anxiety and depression Finding himself feeling stressed Does do counseling through behavioral counseling Feels like things are starting to improve to some degree He is very discouraged by his chronic hand pain from where crush injury occurred he has complex regional dystrophy pain which they have tried some injections of nerve blocks so far this is not helped much he is unable to use his hand much and unable to go back to work Patient also finds himself stressed and depressed but states the medicine is helping some he will be going back to see the counselor soon    Review of Systems Hand pain, depression, anxiety    Objective:   Physical Exam  Patient is sullen denies being suicidal.  Does take his medications      Assessment & Plan:  Anxiety and depression Making slow progress Continue counseling I have tried several times reaching out to the counselor with no success We will try to reach out again If the patient is not showing significant improvement over the next 2 weeks I recommend psychiatry referral Patient is working with his Workmen's Comp. as well as hand surgeon and pain management doctor He will follow up here in 3 months  Estimated return to work April 18

## 2021-03-23 ENCOUNTER — Encounter: Payer: Self-pay | Admitting: Family Medicine

## 2021-03-31 ENCOUNTER — Encounter: Payer: Self-pay | Admitting: Family Medicine

## 2021-04-01 NOTE — Telephone Encounter (Signed)
Nurses Please communicate with Jacob Oconnor. I am not doubting that he is having problems but certainly we need more details-we are trying to be as sympathetic as possible. This is a very complex situation that requires more than just me writing out a date on a letter  What is the reasoning for extending the work leave? This is something that may start needing more than just my opinion and possibly even specialist opinion.  So therefore it is necessary to know the reason for him to have an extension on her work excuse  Nurses not only find out the reason for the extended work leave but how is the patient doing in that regards.  Insurance companies and disability companies will demand that we show specifically the reasons why and validated with facts.  (Is it has hand pain?  Inability to do the job and if so for which reason?  Is it due to anxiety and depression?  Plus also we would need input from his counselor stating that he needs to be out if it is due to anxiety and depression?  Also if it is due to anxiety?  Find out when was last time he went for counseling?)

## 2021-04-06 ENCOUNTER — Encounter: Payer: Self-pay | Admitting: Family Medicine

## 2021-06-16 ENCOUNTER — Other Ambulatory Visit: Payer: Self-pay

## 2021-06-16 ENCOUNTER — Encounter: Payer: Self-pay | Admitting: Family Medicine

## 2021-06-16 ENCOUNTER — Ambulatory Visit (INDEPENDENT_AMBULATORY_CARE_PROVIDER_SITE_OTHER): Payer: Managed Care, Other (non HMO) | Admitting: Family Medicine

## 2021-06-16 VITALS — BP 116/77 | HR 84 | Temp 98.1°F | Ht 68.0 in | Wt 157.0 lb

## 2021-06-16 DIAGNOSIS — G90511 Complex regional pain syndrome I of right upper limb: Secondary | ICD-10-CM

## 2021-06-16 MED ORDER — DULOXETINE HCL 30 MG PO CPEP: 30.0000 mg | ORAL_CAPSULE | Freq: Every day | ORAL | 1 refills | Status: DC

## 2021-06-16 NOTE — Progress Notes (Signed)
   Subjective:    Patient ID: Jacob Oconnor, male    DOB: May 08, 1971, 50 y.o.   MRN: [id number]  Anxiety    Depression        Past medical history includes anxiety.   Arthritis  Both hands  Has arthralgias in both hands In addition to this has complex regional sympathy in the right hand Difficulty flexing his hand all the way to Denies any major setbacks No longer under Workmen's Comp. Denies being depressed Is looking at working from home   Review of Systems  Musculoskeletal:  Positive for arthritis.  Psychiatric/Behavioral:  Positive for depression.       Objective:   Physical Exam  Lungs clear heart regular extremities no edema  Has difficulty flexing his hand in all the way    Assessment & Plan:  Complex regional sympathy in addition to this difficulty flexing the hand then continue current measures do home exercises May continue Cymbalta but I recommend 30 mg Eventually he should come off of this Celebrex may be continued but if he improves I encouraged him to stop taking this  Recommend follow-up office visit for wellness later this year

## 2021-06-17 NOTE — Progress Notes (Signed)
06/17/21- referral cancelled

## 2021-06-17 NOTE — Addendum Note (Signed)
Addended by: Marlowe Shores on: 06/17/2021 03:10 PM   Modules accepted: Orders

## 2021-09-06 ENCOUNTER — Ambulatory Visit: Payer: Managed Care, Other (non HMO) | Admitting: Family Medicine

## 2021-09-06 ENCOUNTER — Other Ambulatory Visit: Payer: Self-pay

## 2021-09-06 ENCOUNTER — Encounter: Payer: Self-pay | Admitting: Family Medicine

## 2021-09-06 VITALS — BP 107/75 | HR 79 | Temp 98.1°F | Wt 159.2 lb

## 2021-09-06 DIAGNOSIS — R079 Chest pain, unspecified: Secondary | ICD-10-CM | POA: Diagnosis not present

## 2021-09-06 DIAGNOSIS — Z13220 Encounter for screening for lipoid disorders: Secondary | ICD-10-CM

## 2021-09-06 DIAGNOSIS — Z79899 Other long term (current) drug therapy: Secondary | ICD-10-CM

## 2021-09-06 NOTE — Progress Notes (Signed)
   Subjective:    Patient ID: Jacob Oconnor, male    DOB: [birth year], 50 y.o.   MRN: [id number]  HPI Pt has been noticing stabbing pain in left side of chest over last couple of weeks. Last about 30 sec about 2-3 times per day. Has began baby ASA just in case.   Sharp Pressure 2 to 3 times a day Sometimes with movement Took aspirin No acid reflux sx No doe No nocturnal sx Smoking some  Some CAD with both parents No stents with parents  But uncles with CAD  Review of Systems     Objective:   Physical Exam  Lungs are clear heart regular pulse normal extremities no edema skin warm dry      Assessment & Plan:  Chest pressure-low likelihood of this being cardiac but at the same time I cannot rule this out because he has fairly strong family history of heart disease as well as being a smoker.  Given this I believe the patient would benefit from a stress Myoview or potentially cardiac CT test will touch base with cardiology.  Hopefully they will be able to fit him in in the near future  Screening labs recommended.  Patient has been counseled to quit smoking

## 2021-09-08 LAB — BASIC METABOLIC PANEL
BUN/Creatinine Ratio: 13 (ref 9–20)
BUN: 10 mg/dL (ref 6–24)
CO2: 23 mmol/L (ref 20–29)
Calcium: 9.7 mg/dL (ref 8.7–10.2)
Chloride: 100 mmol/L (ref 96–106)
Creatinine, Ser: 0.79 mg/dL (ref 0.76–1.27)
Glucose: 94 mg/dL (ref 65–99)
Potassium: 4.3 mmol/L (ref 3.5–5.2)
Sodium: 139 mmol/L (ref 134–144)
eGFR: 108 mL/min/{1.73_m2} (ref >59)

## 2021-09-08 LAB — LIPID PANEL
Chol/HDL Ratio: 5.7 ratio — ABNORMAL HIGH (ref 0.0–5.0)
Cholesterol, Total: 228 mg/dL — ABNORMAL HIGH (ref 100–199)
HDL: 40 mg/dL (ref >39)
LDL Chol Calc (NIH): 167 mg/dL — ABNORMAL HIGH (ref 0–99)
Triglycerides: 116 mg/dL (ref 0–149)
VLDL Cholesterol Cal: 21 mg/dL (ref 5–40)

## 2021-09-20 ENCOUNTER — Ambulatory Visit: Payer: Managed Care, Other (non HMO) | Admitting: Family Medicine

## 2021-09-21 ENCOUNTER — Encounter: Payer: Self-pay | Admitting: Family Medicine

## 2021-09-21 NOTE — Telephone Encounter (Signed)
Per Dr Lorin Picket: Patient may have appt 09/22/21 at 11:20am- Urgent care if worse- Father verbalized understanding.

## 2021-09-28 NOTE — Progress Notes (Signed)
Cardiology Office Note:    Date:  09/29/2021   ID:  Jacob Oconnor, DOB [birth year], MRN [id number]  PCP:  Jacob Sciara, MD   Jacob Oconnor Providers Cardiologist:  None     Referring MD: Jacob Sciara, MD   No chief complaint on file.   History of Present Illness:    Jacob Oconnor is a 50 y.o. male with a hx of smoking,  HLD, seeing cardiology for assessment of chest pain  He was seen in September of 2022. He noted stabbing left sided chest pain started 1.5 to 2 months ago. He was sitting down. It happens 2-3 times over the course of the day. It lasted a few seconds about 2-3 x per day.  No exacerbating symptoms He can walk up flight of stairs with no chest pain or dyspnea on exertion. He started taking a baby aspirin. No 1st degree family hx of MI. He was seen by Jacob Oconnor and CP was c/w atypical cp.He was referred to cardiology for consideration of cardiac CT. He has normal renal function. He has a dog that he walks several times per day.  Surgical history: ACL/MCL repair  Family history: parents had angina in there 60s-70s. Has 8 siblings no cardiac disease hx.  Social: 5 cigarettes per day. No IVDU. He had a hand injury was working for [employer]. He's from [place].  CVD Risk/Equivalent: HLD-  LDL 160, PCP was suggested to start crestor 10 mg ; however Jacob Oconnor preferred to discuss with cardiology first Past Medical History:  Diagnosis Date   Complex regional pain syndrome of right upper extremity 02/08/2021   Due to a crush injury from work, followed by Jacob Oconnor bone and joint, being treated with gabapentin    Past Surgical History:  Procedure Laterality Date   ANTERIOR CRUCIATE LIGAMENT REPAIR     HERNIA REPAIR     TONSILLECTOMY      Current Medications: Current Meds  Medication Sig   albuterol (VENTOLIN HFA) 108 (90 Base) MCG/ACT inhaler Inhale 2 puffs into the lungs every 4 (four) hours as needed for wheezing.   cetirizine  (ZYRTEC) 10 MG tablet Take 10 mg by mouth daily.   diclofenac Sodium (VOLTAREN) 1 % GEL Apply topically 4 (four) times daily.   DULoxetine (CYMBALTA) 30 MG capsule Take 1 capsule (30 mg total) by mouth daily.   FLOVENT HFA 44 MCG/ACT inhaler TAKE 2 PUFFS BY MOUTH TWICE A DAY   triamcinolone (KENALOG) 0.1 % Apply bid prn     Allergies:   Patient has no known allergies.   Social History   Socioeconomic History   Marital status: Married    Spouse name: Not on file   Number of children: Not on file   Years of education: Not on file   Highest education level: Not on file  Occupational History   Not on file  Tobacco Use   Smoking status: Every Day    Types: Cigarettes   Smokeless tobacco: Never  Substance and Sexual Activity   Alcohol use: No   Drug use: No   Sexual activity: Not on file  Other Topics Concern   Not on file  Social History Narrative   Not on file   Social Determinants of Health   Financial Resource Strain: Not on file  Food Insecurity: Not on file  Transportation Needs: Not on file  Physical Activity: Not on file  Stress: Not on file  Social Connections: Not on  file     Family History: The patient's parents had angina in their 64s-70s. No known family hx of MI, stroke.  ROS:   Please see the history of present illness.     All other systems reviewed and are negative.  EKGs/Labs/Other Studies Reviewed:    The following studies were reviewed today:   EKG:  EKG is  ordered today.  The ekg ordered today demonstrates    09/29/2021- NSR, normal EKG  09/06/2021- NSR, no ischemic ST-T changes  Recent Labs: 09/07/2021: BUN 10; Creatinine, Ser 0.79; Potassium 4.3; Sodium 139  Recent Lipid Panel    Component Value Date/Time   CHOL 228 (H) 09/07/2021 0918   TRIG 116 09/07/2021 0918   HDL 40 09/07/2021 0918   CHOLHDL 5.7 (H) 09/07/2021 0918   LDLCALC 167 (H) 09/07/2021 0918     Risk Assessment/Calculations:     The 10-year ASCVD risk score  (Arnett DK, et al., 2019) is: 9.4%   Values used to calculate the score:     Age: 20 years     Sex: Male     Is Non-Hispanic African American: No     Diabetic: No     Tobacco smoker: Yes     Systolic Blood Pressure: 110 mmHg     Is BP treated: No     HDL Cholesterol: 40 mg/dL     Total Cholesterol: 228 mg/dL       Physical Exam:    VS:  BP 110/60   Pulse 74   Ht 5\' 7"  (1.702 m)   Wt 160 lb (72.6 kg)   SpO2 98%   BMI 25.06 kg/m     Wt Readings from Last 3 Encounters:  09/29/21 160 lb (72.6 kg)  09/06/21 159 lb 3.2 oz (72.2 kg)  06/16/21 157 lb (71.2 kg)     GEN:  Well nourished, well developed in no acute distress HEENT: Normal NECK: No JVD; No carotid bruits CARDIAC: RRR, no murmurs, rubs, gallops Vasc: 2+ radial pulses BL RESPIRATORY:  Clear to auscultation without rales, wheezing or rhonchi  ABDOMEN: Soft, non-tender, non-distended MUSCULOSKELETAL:  No edema; No deformity  SKIN: Warm and dry NEUROLOGIC:  Alert and oriented x 3 PSYCHIATRIC:  Normal affect   ASSESSMENT:   #Atypical Chest Pain: Jacob Oconnor symptoms are relatively atypical for CAD. However, he does have risk factors with intermediate ASCVD. He is ambivalent about starting a statin so we discussed a coronary CT with calcium scoring to risk stratify his need for a statin. Will talk to PCP about nicotine patches and wellbutrin. We will see him in 6 months for follow-up. I can communicated with him via the EHR to discuss plan for statin and can include Jacob Oconnor.  PLAN:    In order of problems listed above:  Coronary CT and calcium score      Medication Adjustments/Labs and Tests Ordered: Current medicines are reviewed at length with the patient today.  Concerns regarding medicines are outlined above.   Signed, Jacob Diss, MD  09/29/2021 9:14 AM    [facility]

## 2021-09-29 ENCOUNTER — Ambulatory Visit: Payer: Managed Care, Other (non HMO) | Admitting: Internal Medicine

## 2021-09-29 ENCOUNTER — Encounter: Payer: Self-pay | Admitting: Internal Medicine

## 2021-09-29 ENCOUNTER — Other Ambulatory Visit: Payer: Self-pay

## 2021-09-29 VITALS — BP 110/60 | HR 74 | Ht 67.0 in | Wt 160.0 lb

## 2021-09-29 DIAGNOSIS — Z9189 Other specified personal risk factors, not elsewhere classified: Secondary | ICD-10-CM | POA: Diagnosis not present

## 2021-09-29 DIAGNOSIS — Z720 Tobacco use: Secondary | ICD-10-CM

## 2021-09-29 NOTE — Patient Instructions (Addendum)
Medication Instructions:  Your physician recommends that you continue on your current medications as directed. Please refer to the Current Medication list given to you today.  *If you need a refill on your cardiac medications before your next appointment, please call your pharmacy*   Lab Work: None If you have labs (blood work) drawn today and your tests are completely normal, you will receive your results only by: MyChart Message (if you have MyChart) OR A paper copy in the mail If you have any lab test that is abnormal or we need to change your treatment, we will call you to review the results.   Testing/Procedures: Coronary Calcium Scan - order will be placed and there will be a $99.00 out of pocket fee. They will call to schedule the appointment.     Follow-Up: At [facility], you and your health needs are our priority.  As part of our continuing mission to provide you with exceptional heart care, we have created designated Provider Care Teams.  These Care Teams include your primary Cardiologist (physician) and Advanced Practice Providers (APPs -  Physician Assistants and Nurse Practitioners) who all work together to provide you with the care you need, when you need it.  We recommend signing up for the patient portal called "MyChart".  Sign up information is provided on this After Visit Summary.  MyChart is used to connect with patients for Virtual Visits (Telemedicine).  Patients are able to view lab/test results, encounter notes, upcoming appointments, etc.  Non-urgent messages can be sent to your provider as well.   To learn more about what you can do with MyChart, go to [email or web address].    Your next appointment:   3 month(s)  The format for your next appointment:   In Person  Provider:   Dr. Carolan Clines   Other Instructions  Mediterranean Diet A Mediterranean diet refers to food and lifestyle choices that are based on the traditions of countries located on the  [place]. This way of eating has been shown to help prevent certain conditions and improve outcomes for people who have chronic diseases, like kidney disease and heart disease. What are tips for following this plan? Lifestyle Cook and eat meals together with your family, when possible. Drink enough fluid to keep your urine clear or pale yellow. Be physically active every day. This includes: Aerobic exercise like running or swimming. Leisure activities like gardening, walking, or housework. Get 7-8 hours of sleep each night. If recommended by your health care provider, drink red wine in moderation. This means 1 glass a day for nonpregnant women and 2 glasses a day for men. A glass of wine equals 5 oz (150 mL). Reading food labels  Check the serving size of packaged foods. For foods such as rice and pasta, the serving size refers to the amount of cooked product, not dry. Check the total fat in packaged foods. Avoid foods that have saturated fat or trans fats. Check the ingredients list for added sugars, such as corn syrup. Shopping At the grocery store, buy most of your food from the areas near the walls of the store. This includes: Fresh fruits and vegetables (produce). Grains, beans, nuts, and seeds. Some of these may be available in unpackaged forms or large amounts (in bulk). Fresh seafood. Poultry and eggs. Low-fat dairy products. Buy whole ingredients instead of prepackaged foods. Buy fresh fruits and vegetables in-season from local farmers markets. Buy frozen fruits and vegetables in resealable bags. If you do not have  access to quality fresh seafood, buy precooked frozen shrimp or canned fish, such as tuna, salmon, or sardines. Buy small amounts of raw or cooked vegetables, salads, or olives from the deli or salad bar at your store. Stock your pantry so you always have certain foods on hand, such as olive oil, canned tuna, canned tomatoes, rice, pasta, and  beans. Cooking Cook foods with extra-virgin olive oil instead of using butter or other vegetable oils. Have meat as a side dish, and have vegetables or grains as your main dish. This means having meat in small portions or adding small amounts of meat to foods like pasta or stew. Use beans or vegetables instead of meat in common dishes like chili or lasagna. Experiment with different cooking methods. Try roasting or broiling vegetables instead of steaming or sauteing them. Add frozen vegetables to soups, stews, pasta, or rice. Add nuts or seeds for added healthy fat at each meal. You can add these to yogurt, salads, or vegetable dishes. Marinate fish or vegetables using olive oil, lemon juice, garlic, and fresh herbs. Meal planning  Plan to eat 1 vegetarian meal one day each week. Try to work up to 2 vegetarian meals, if possible. Eat seafood 2 or more times a week. Have healthy snacks readily available, such as: Vegetable sticks with hummus. Greek yogurt. Fruit and nut trail mix. Eat balanced meals throughout the week. This includes: Fruit: 2-3 servings a day Vegetables: 4-5 servings a day Low-fat dairy: 2 servings a day Fish, poultry, or lean meat: 1 serving a day Beans and legumes: 2 or more servings a week Nuts and seeds: 1-2 servings a day Whole grains: 6-8 servings a day Extra-virgin olive oil: 3-4 servings a day Limit red meat and sweets to only a few servings a month What are my food choices? Mediterranean diet Recommended Grains: Whole-grain pasta. Brown rice. Bulgar wheat. Polenta. Couscous. Whole-wheat bread. Orpah Cobb. Vegetables: Artichokes. Beets. Broccoli. Cabbage. Carrots. Eggplant. Green beans. Chard. Kale. Spinach. Onions. Leeks. Peas. Squash. Tomatoes. Peppers. Radishes. Fruits: Apples. Apricots. Avocado. Berries. Bananas. Cherries. Dates. Figs. Grapes. Lemons. Melon. Oranges. Peaches. Plums. Pomegranate. Meats and other protein foods: Beans. Almonds.  Sunflower seeds. Pine nuts. Peanuts. Cod. Salmon. Scallops. Shrimp. Tuna. Tilapia. Clams. Oysters. Eggs. Dairy: Low-fat milk. Cheese. Greek yogurt. Beverages: Water. Red wine. Herbal tea. Fats and oils: Extra virgin olive oil. Avocado oil. Grape seed oil. Sweets and desserts: [place] yogurt with honey. Baked apples. Poached pears. Trail mix. Seasoning and other foods: Basil. Cilantro. Coriander. Cumin. Mint. Parsley. Sage. Rosemary. Tarragon. Garlic. Oregano. Thyme. Pepper. Balsalmic vinegar. Tahini. Hummus. Tomato sauce. Olives. Mushrooms. Limit these Grains: Prepackaged pasta or rice dishes. Prepackaged cereal with added sugar. Vegetables: Deep fried potatoes (french fries). Fruits: Fruit canned in syrup. Meats and other protein foods: Beef. Pork. Lamb. Poultry with skin. Hot dogs. Tomasa Blase. Dairy: Ice cream. Sour cream. Whole milk. Beverages: Juice. Sugar-sweetened soft drinks. Beer. Liquor and spirits. Fats and oils: Butter. Canola oil. Vegetable oil. Beef fat (tallow). Lard. Sweets and desserts: Cookies. Cakes. Pies. Candy. Seasoning and other foods: Mayonnaise. Premade sauces and marinades. The items listed may not be a complete list. Talk with your dietitian about what dietary choices are right for you. Summary The Mediterranean diet includes both food and lifestyle choices. Eat a variety of fresh fruits and vegetables, beans, nuts, seeds, and whole grains. Limit the amount of red meat and sweets that you eat. Talk with your health care provider about whether it is safe for you to drink  red wine in moderation. This means 1 glass a day for nonpregnant women and 2 glasses a day for men. A glass of wine equals 5 oz (150 mL). This information is not intended to replace advice given to you by your health care provider. Make sure you discuss any questions you have with your health care provider. Document Revised: 08/04/2016 Document Reviewed: 07/28/2016 Elsevier Patient Education  2020 [employer].

## 2021-10-12 ENCOUNTER — Other Ambulatory Visit: Payer: Self-pay

## 2021-10-12 ENCOUNTER — Ambulatory Visit (INDEPENDENT_AMBULATORY_CARE_PROVIDER_SITE_OTHER)
Admission: RE | Admit: 2021-10-12 | Discharge: 2021-10-12 | Disposition: A | Discharge status: Home / Self Care | Payer: Self-pay | Source: Ambulatory Visit | Attending: Internal Medicine | Admitting: Internal Medicine

## 2021-10-12 DIAGNOSIS — Z9189 Other specified personal risk factors, not elsewhere classified: Secondary | ICD-10-CM

## 2021-10-21 ENCOUNTER — Other Ambulatory Visit: Payer: Self-pay | Admitting: Family Medicine

## 2021-10-23 NOTE — Telephone Encounter (Signed)
Nurses please explain to the patient that we received a request for this medication. According to our record he was no longer on this medicine.  Is this something he is wanting to reinitiate?  Or is this potentially the pharmacy sending notification by automated system but in reality the patient does not want the prescription?  Thank you

## 2021-10-27 NOTE — Telephone Encounter (Signed)
Spoke with patient and he is currently taking duloxetine 60 mg daily. If recommended patient is okay w/ taking 30 mg , please advise

## 2021-10-28 MED ORDER — DULOXETINE HCL 30 MG PO CPEP: 30.0000 mg | ORAL_CAPSULE | Freq: Every day | ORAL | 1 refills | Status: DC

## 2021-10-28 NOTE — Telephone Encounter (Signed)
It would be fine to shift to Cymbalta 30 mg 1 daily, #90, 1 refill, follow-up within 3 months to 4 months sooner if any issues

## 2021-10-28 NOTE — Addendum Note (Signed)
Addended by: Alm Bustard R on: 10/28/2021 09:22 AM   Modules accepted: Orders

## 2021-11-19 ENCOUNTER — Encounter: Payer: Self-pay | Admitting: Family Medicine

## 2021-11-19 NOTE — Telephone Encounter (Signed)
Front Please go ahead with school note for his daughter Jacob Oconnor.  If she should have further trouble follow-up otherwise school note for all of this week  They are requesting to pick up this letter this afternoon please call Kasheem so he can pick this up thanks-Liberti Appleton

## 2021-12-30 ENCOUNTER — Ambulatory Visit: Payer: Managed Care, Other (non HMO) | Admitting: Internal Medicine

## 2022-05-20 ENCOUNTER — Other Ambulatory Visit: Payer: Self-pay | Admitting: Family Medicine

## 2022-05-20 ENCOUNTER — Encounter: Payer: Self-pay | Admitting: Family Medicine

## 2022-05-20 MED ORDER — MELOXICAM 15 MG PO TABS: 15.0000 mg | ORAL_TABLET | Freq: Every day | ORAL | 0 refills | Status: DC | PRN

## 2022-05-24 ENCOUNTER — Ambulatory Visit: Payer: Managed Care, Other (non HMO) | Admitting: Family Medicine

## 2022-05-24 ENCOUNTER — Ambulatory Visit (HOSPITAL_COMMUNITY)
Admission: RE | Admit: 2022-05-24 | Discharge: 2022-05-24 | Disposition: A | Discharge status: Home / Self Care | Payer: Managed Care, Other (non HMO) | Source: Ambulatory Visit | Attending: Family Medicine | Admitting: Family Medicine

## 2022-05-24 VITALS — BP 103/66 | HR 88 | Temp 97.4°F | Ht 67.0 in | Wt 157.0 lb

## 2022-05-24 DIAGNOSIS — G8929 Other chronic pain: Secondary | ICD-10-CM

## 2022-05-24 DIAGNOSIS — M25562 Pain in left knee: Secondary | ICD-10-CM

## 2022-05-24 MED ORDER — TRAMADOL HCL 50 MG PO TABS: 50.0000 mg | ORAL_TABLET | Freq: Three times a day (TID) | ORAL | 0 refills | Status: DC | PRN

## 2022-05-24 NOTE — Assessment & Plan Note (Addendum)
X-ray was obtained today and was independently reviewed by me.  Interpretation: Degenerative changes/degenerative joint disease noted with the medial compartment most affected.  Chondrocalcinosis noted.  Patient is to continue his anti-inflammatory.  Tramadol as needed for moderate to severe pain.  Referring to orthopedics.

## 2022-05-24 NOTE — Progress Notes (Signed)
Subjective:  Patient ID: Jacob Oconnor, male    DOB: Jan 19, 1971  Age: 51 y.o. MRN: [id number]  CC: Chief Complaint  Patient presents with   left knee pain     Old ACL injury due to sports, now worsening - trying all remedies and meds  Requesting recommendations     HPI:  51 year old male presents for evaluation of the above.  Patient reports that years ago he injured his ACL and had surgery.  He states that his left knee has been bothering him since that time.  He states that over the past week his pain has worsened.  No recent fall, trauma, injury.  He states that he believes he has significant arthritis in the knee and he would like to see an orthopedic surgeon.  He has not had x-rays in many years.  He states that his pain seems to be worsening and progressing.  He was recently placed on an anti-inflammatory.  He states that this does help some.  Patient Active Problem List   Diagnosis Date Noted   Chronic pain of left knee 05/24/2022   Complex regional pain syndrome of right upper extremity 02/08/2021   Rhinitis, allergic 08/04/2016   Tobacco abuse 04/23/2014    Social Hx   Social History   Socioeconomic History   Marital status: Married    Spouse name: Not on file   Number of children: Not on file   Years of education: Not on file   Highest education level: Not on file  Occupational History   Not on file  Tobacco Use   Smoking status: Every Day    Types: Cigarettes   Smokeless tobacco: Never  Substance and Sexual Activity   Alcohol use: No   Drug use: No   Sexual activity: Not on file  Other Topics Concern   Not on file  Social History Narrative   Not on file   Social Determinants of Health   Financial Resource Strain: Not on file  Food Insecurity: Not on file  Transportation Needs: Not on file  Physical Activity: Not on file  Stress: Not on file  Social Connections: Not on file    Review of Systems Per HPI  Objective:  BP 103/66   Pulse 88    Temp (!) 97.4 F (36.3 C)   Ht 5\' 7"  (1.702 m)   Wt 157 lb (71.2 kg)   SpO2 96%   BMI 24.59 kg/m      05/24/2022    9:21 AM 09/29/2021    8:46 AM 09/06/2021    3:52 PM  BP/Weight  Systolic BP 103 110 107  Diastolic BP 66 60 75  Wt. (Lbs) 157 160 159.2  BMI 24.59 kg/m2 25.06 kg/m2 24.21 kg/m2    Physical Exam Vitals and nursing note reviewed.  Constitutional:      General: He is not in acute distress.    Appearance: Normal appearance.  HENT:     Head: Normocephalic and atraumatic.  Eyes:     General:        Right eye: No discharge.        Left eye: No discharge.     Conjunctiva/sclera: Conjunctivae normal.  Pulmonary:     Effort: Pulmonary effort is normal. No respiratory distress.  Musculoskeletal:     Comments: Left knee -varus deformity noted.  Anterior joint line tenderness.  Mild swelling.  Ligaments intact.  Neurological:     Mental Status: He is alert.  Psychiatric:  Mood and Affect: Mood normal.        Behavior: Behavior normal.    Lab Results  Component Value Date   WBC 9.3 03/28/2012   HGB 15.9 03/28/2012   HCT 45.4 03/28/2012   PLT 232 03/28/2012   GLUCOSE 94 09/07/2021   CHOL 228 (H) 09/07/2021   TRIG 116 09/07/2021   HDL 40 09/07/2021   LDLCALC 167 (H) 09/07/2021   ALT 10 03/28/2012   AST 13 03/28/2012   NA 139 09/07/2021   K 4.3 09/07/2021   CL 100 09/07/2021   CREATININE 0.79 09/07/2021   BUN 10 09/07/2021   CO2 23 09/07/2021     Assessment & Plan:   Problem List Items Addressed This Visit       Other   Chronic pain of left knee - Primary    X-ray was obtained today and was independently reviewed by me.  Interpretation: Degenerative changes/degenerative joint disease noted with the medial compartment most affected.  Chondrocalcinosis noted.  Patient is to continue his anti-inflammatory.  Tramadol as needed for moderate to severe pain.  Referring to orthopedics.       Relevant Medications   traMADol (ULTRAM) 50 MG tablet    Other Relevant Orders   DG Knee Complete 4 Views Left (Completed)   Ambulatory referral to Orthopedic Surgery    Meds ordered this encounter  Medications   traMADol (ULTRAM) 50 MG tablet    Sig: Take 1-2 tablets (50-100 mg total) by mouth every 8 (eight) hours as needed for severe pain or moderate pain.    Dispense:  15 tablet    Refill:  0   Jesaiah Fabiano DO [facility]

## 2022-05-24 NOTE — Patient Instructions (Signed)
Xray today.  Medication as prescribed.  I will place referral to Ortho after talking with Dr. Wolfgang Phoenix.  Take care  Dr. Lacinda Axon

## 2022-05-26 ENCOUNTER — Other Ambulatory Visit: Payer: Self-pay | Admitting: Family Medicine

## 2022-05-26 MED ORDER — HYDROCODONE-ACETAMINOPHEN 5-325 MG PO TABS: ORAL_TABLET | ORAL | 0 refills | Status: DC

## 2022-05-26 NOTE — Telephone Encounter (Signed)
Nurses Please have Leandra Kern referral as urgent hopefully they can see him relatively soon  I have sent in a limited number of hydrocodone which is a mild narcotic that could be used to help with the pain this is not a medication that he would like to take frequently because our body can get used to it.  Stop the tramadol.  As for the meloxicam continue as is it is the maximum dose per day  Hopefully orthopedics could try injection or other measures.  In some situations they will also do MRI.  Hopefully that helps thanks-Dr. Lorin Picket

## 2022-06-16 ENCOUNTER — Other Ambulatory Visit: Payer: Self-pay | Admitting: Family Medicine

## 2022-06-16 NOTE — Telephone Encounter (Signed)
May have this with 2 refills follow-up if ongoing troubles

## 2022-07-08 ENCOUNTER — Encounter: Payer: Self-pay | Admitting: Podiatry

## 2022-07-08 ENCOUNTER — Ambulatory Visit: Payer: Managed Care, Other (non HMO) | Admitting: Podiatry

## 2022-07-08 DIAGNOSIS — Z79899 Other long term (current) drug therapy: Secondary | ICD-10-CM

## 2022-07-08 DIAGNOSIS — B351 Tinea unguium: Secondary | ICD-10-CM

## 2022-07-08 NOTE — Progress Notes (Signed)
Subjective:  Patient ID: Jacob Oconnor, male    DOB: 10-04-1971,  MRN: [id number]  Chief Complaint  Patient presents with   Nail Problem    Patient is here for left foot great toe nail fungus.    51 y.o. male presents with the above complaint.  Patient presents with thickened elongated dystrophic toenail x1 left hallux.  Patient states that his nail fungus to his been present for a long time.  He is back today wanted to get it evaluated he has not seen anyone else prior to seeing me.  It does not hurt.  He wanted to discuss treatment options for it.   Review of Systems: Negative except as noted in the HPI. Denies N/V/F/Ch.  Past Medical History:  Diagnosis Date   Complex regional pain syndrome of right upper extremity 02/08/2021   Due to a crush injury from work, followed by Dr. Janalee Dane bone and joint, being treated with gabapentin    Current Outpatient Medications:    albuterol (VENTOLIN HFA) 108 (90 Base) MCG/ACT inhaler, Inhale 2 puffs into the lungs every 4 (four) hours as needed for wheezing., Disp: 18 g, Rfl: 1   cetirizine (ZYRTEC) 10 MG tablet, Take 10 mg by mouth daily., Disp: , Rfl:    diclofenac Sodium (VOLTAREN) 1 % GEL, Apply topically 4 (four) times daily., Disp: , Rfl:    FLOVENT HFA 44 MCG/ACT inhaler, TAKE 2 PUFFS BY MOUTH TWICE A DAY, Disp: 31.8 Inhaler, Rfl: 1   HYDROcodone-acetaminophen (NORCO/VICODIN) 5-325 MG tablet, 1 every 4-6 hours as needed for severe pain caution drowsiness, Disp: 20 tablet, Rfl: 0   meloxicam (MOBIC) 15 MG tablet, TAKE 1 TABLET BY MOUTH EVERY DAY AS NEEDED FOR PAIN, Disp: 30 tablet, Rfl: 2  Social History   Tobacco Use  Smoking Status Every Day   Types: Cigarettes  Smokeless Tobacco Never    No Known Allergies Objective:  There were no vitals filed for this visit. There is no height or weight on file to calculate BMI. Constitutional Well developed. Well nourished.  Vascular Dorsalis pedis pulses palpable  bilaterally. Posterior tibial pulses palpable bilaterally. Capillary refill normal to all digits.  No cyanosis or clubbing noted. Pedal hair growth normal.  Neurologic Normal speech. Oriented to person, place, and time. Epicritic sensation to light touch grossly present bilaterally.  Dermatologic Nails thickened elongated dystrophic mycotic toenails x1 left hallux. Skin within normal limits  Orthopedic: Normal joint ROM without pain or crepitus bilaterally. No visible deformities. No bony tenderness.   Radiographs: None Assessment:   1. Encounter for long-term current use of medication   2. Nail fungus   3. Onychomycosis due to dermatophyte    Plan:  Patient was evaluated and treated and all questions answered.  Left hallux onychomycosis -Educated the patient on the etiology of onychomycosis and various treatment options associated with improving the fungal load.  I explained to the patient that there is 3 treatment options available to treat the onychomycosis including topical, p.o., laser treatment.  Patient elected to undergo p.o. options with Lamisil/terbinafine therapy.  In order for me to start the medication therapy, I explained to the patient the importance of evaluating the liver and obtaining the liver function test.  Once the liver function test comes back normal I will start him on 19-month course of Lamisil therapy.  Patient understood all risk and would like to proceed with Lamisil therapy.  I have asked the patient to immediately stop the Lamisil therapy if she has  any reactions to it and call the office or go to the emergency room right away.  Patient states understanding   No follow-ups on file.

## 2022-07-11 ENCOUNTER — Other Ambulatory Visit: Payer: Self-pay | Admitting: Podiatry

## 2022-07-12 ENCOUNTER — Encounter: Payer: Self-pay | Admitting: Family Medicine

## 2022-07-12 ENCOUNTER — Other Ambulatory Visit: Payer: Self-pay | Admitting: Family Medicine

## 2022-07-12 DIAGNOSIS — Z13220 Encounter for screening for lipoid disorders: Secondary | ICD-10-CM

## 2022-07-12 DIAGNOSIS — Z125 Encounter for screening for malignant neoplasm of prostate: Secondary | ICD-10-CM

## 2022-07-12 DIAGNOSIS — Z79899 Other long term (current) drug therapy: Secondary | ICD-10-CM

## 2022-07-12 DIAGNOSIS — Z0000 Encounter for general adult medical examination without abnormal findings: Secondary | ICD-10-CM

## 2022-07-12 LAB — HEPATIC FUNCTION PANEL
ALT: 16 IU/L (ref 0–44)
AST: 16 IU/L (ref 0–40)
Albumin: 4.8 g/dL (ref 3.8–4.9)
Alkaline Phosphatase: 74 IU/L (ref 44–121)
Bilirubin Total: 0.5 mg/dL (ref 0.0–1.2)
Bilirubin, Direct: 0.11 mg/dL (ref 0.00–0.40)
Total Protein: 7 g/dL (ref 6.0–8.5)

## 2022-07-12 LAB — SPECIMEN STATUS REPORT

## 2022-07-12 MED ORDER — HYDROCODONE-ACETAMINOPHEN 5-325 MG PO TABS: ORAL_TABLET | ORAL | 0 refills | Status: AC

## 2022-07-12 NOTE — Telephone Encounter (Signed)
Nurses- Refill of his hydrocodone was sent to [facility] here in [place].  It is for occasional use for severe pain caution drowsiness.  If he needs an additional refill in the future he will need to do a office visit because this is a controlled medicine.  As for the meloxicam please verify with patient that he would prefer a 90-day prescription through [facility] if that is so you may send in a 90-day prescription with 1 refill  Meloxicam does help with pain and discomfort but can stress kidney function so therefore it is wise to periodically do blood work and on days where he does not feel he needs the medication to lay off of the medicine.  Also please remind Vraj that it is wise for him to do a wellness exam with lab work Most insurances cover this without problems under wellness I recommend that he schedule a physical And also does the following labs lipid, liver, metabolic 7, PSA If he is in agreements please order the labs and he can set up his appointment to be done in the next 60 days preferably Thanks-Dr. Lorin Picket

## 2022-07-14 ENCOUNTER — Encounter: Payer: Self-pay | Admitting: Podiatry

## 2022-07-14 NOTE — Telephone Encounter (Signed)
Nurses May have 90-day prescription of meloxicam with 1 r additional refill and also lipid, met 7, PSA for wellness  and patient to schedule wellness accordingly

## 2022-07-15 ENCOUNTER — Other Ambulatory Visit: Payer: Self-pay | Admitting: Podiatry

## 2022-07-15 MED ORDER — TERBINAFINE HCL 250 MG PO TABS: 250.0000 mg | ORAL_TABLET | Freq: Every day | ORAL | 0 refills | Status: AC

## 2022-07-15 MED ORDER — MELOXICAM 15 MG PO TABS: ORAL_TABLET | ORAL | 1 refills | Status: DC

## 2022-09-02 ENCOUNTER — Encounter: Payer: Self-pay | Admitting: Family Medicine

## 2022-09-02 NOTE — Telephone Encounter (Signed)
Message placed in pt chart and sent to PCP 

## 2022-09-08 NOTE — Telephone Encounter (Signed)
Message transcribed to patient chart and sent to provider

## 2022-11-08 ENCOUNTER — Encounter: Payer: Self-pay | Admitting: Podiatry

## 2022-11-09 ENCOUNTER — Ambulatory Visit: Payer: Managed Care, Other (non HMO) | Admitting: Podiatry

## 2022-12-03 ENCOUNTER — Other Ambulatory Visit: Payer: Self-pay | Admitting: Family Medicine

## 2022-12-13 ENCOUNTER — Ambulatory Visit: Payer: Managed Care, Other (non HMO) | Admitting: Podiatry

## 2023-01-06 ENCOUNTER — Other Ambulatory Visit: Payer: Self-pay | Admitting: Podiatry

## 2023-01-06 ENCOUNTER — Other Ambulatory Visit: Payer: Self-pay | Admitting: Family Medicine

## 2023-01-15 ENCOUNTER — Encounter: Payer: Self-pay | Admitting: Family Medicine

## 2023-01-16 NOTE — Telephone Encounter (Signed)
Telephone call placed in pt chart and sent to provider

## 2023-02-15 IMAGING — DX DG KNEE COMPLETE 4+V*L*
4 series · 4 of 4 positions shown · non-contrast
Comparison: None Available.

CLINICAL DATA: Worsening knee pain, history of prior ACL repair.

EXAM:
LEFT KNEE - COMPLETE 4+ VIEW

[knee ap]
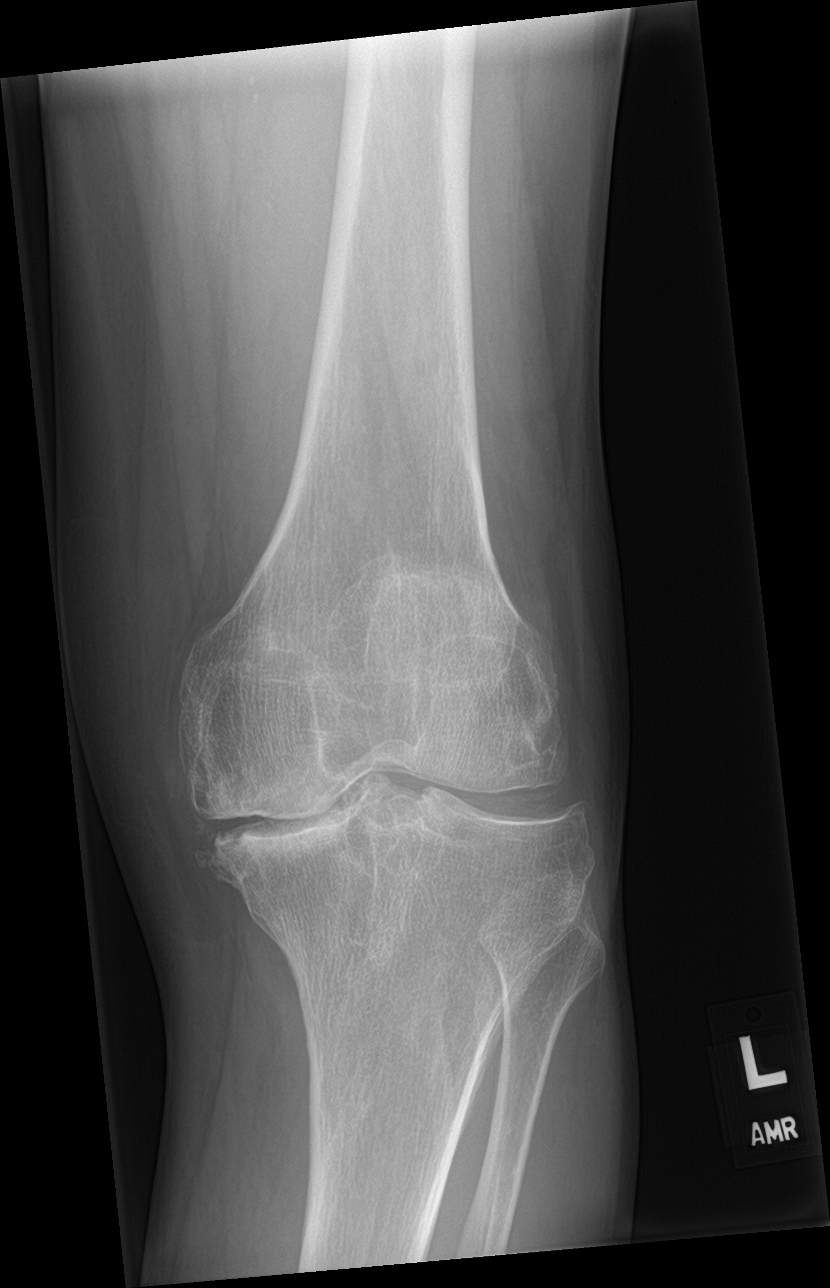

[knee obl (1 of 2)]
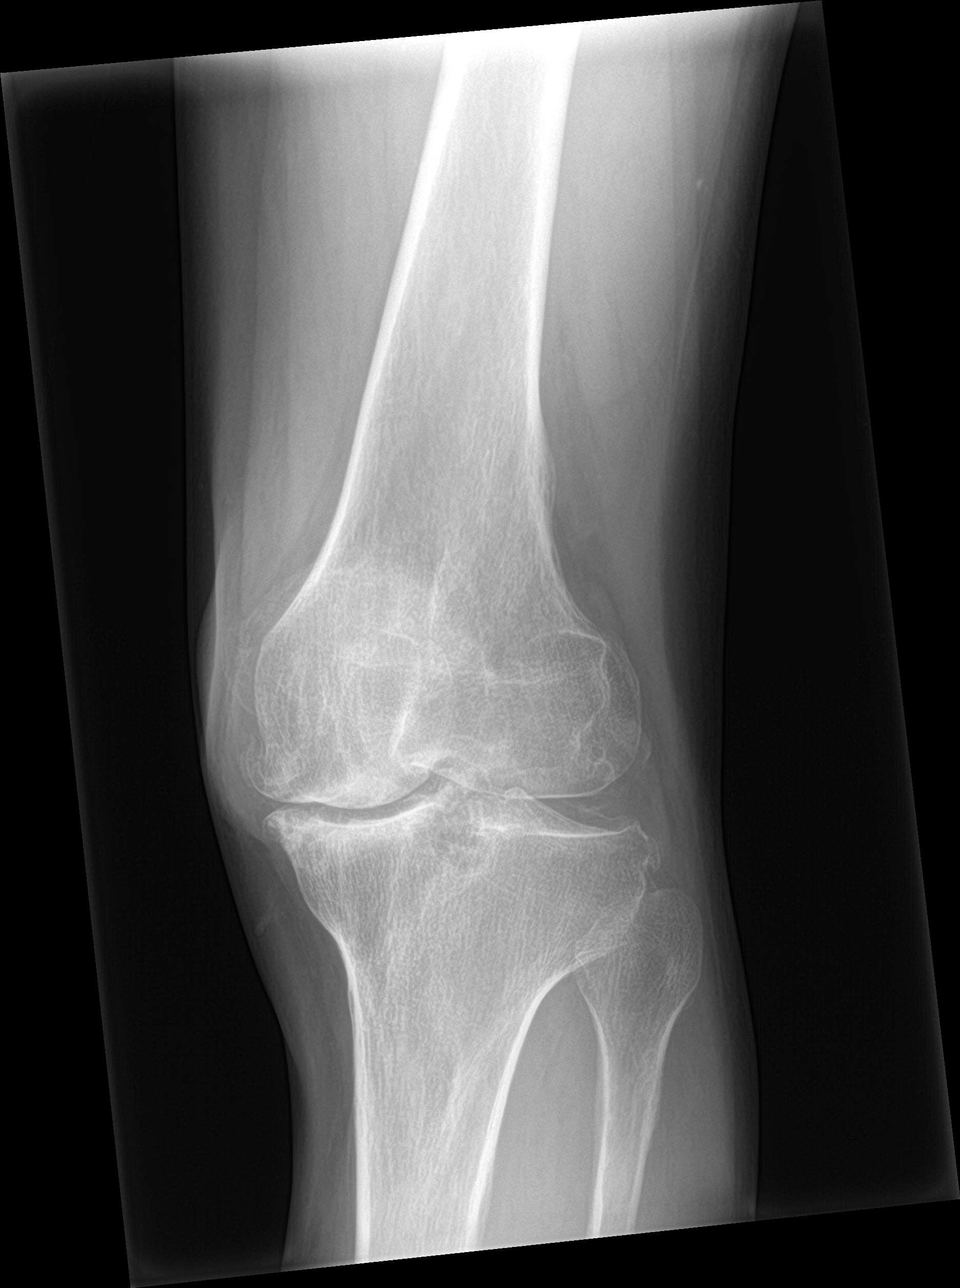

[knee obl (2 of 2)]
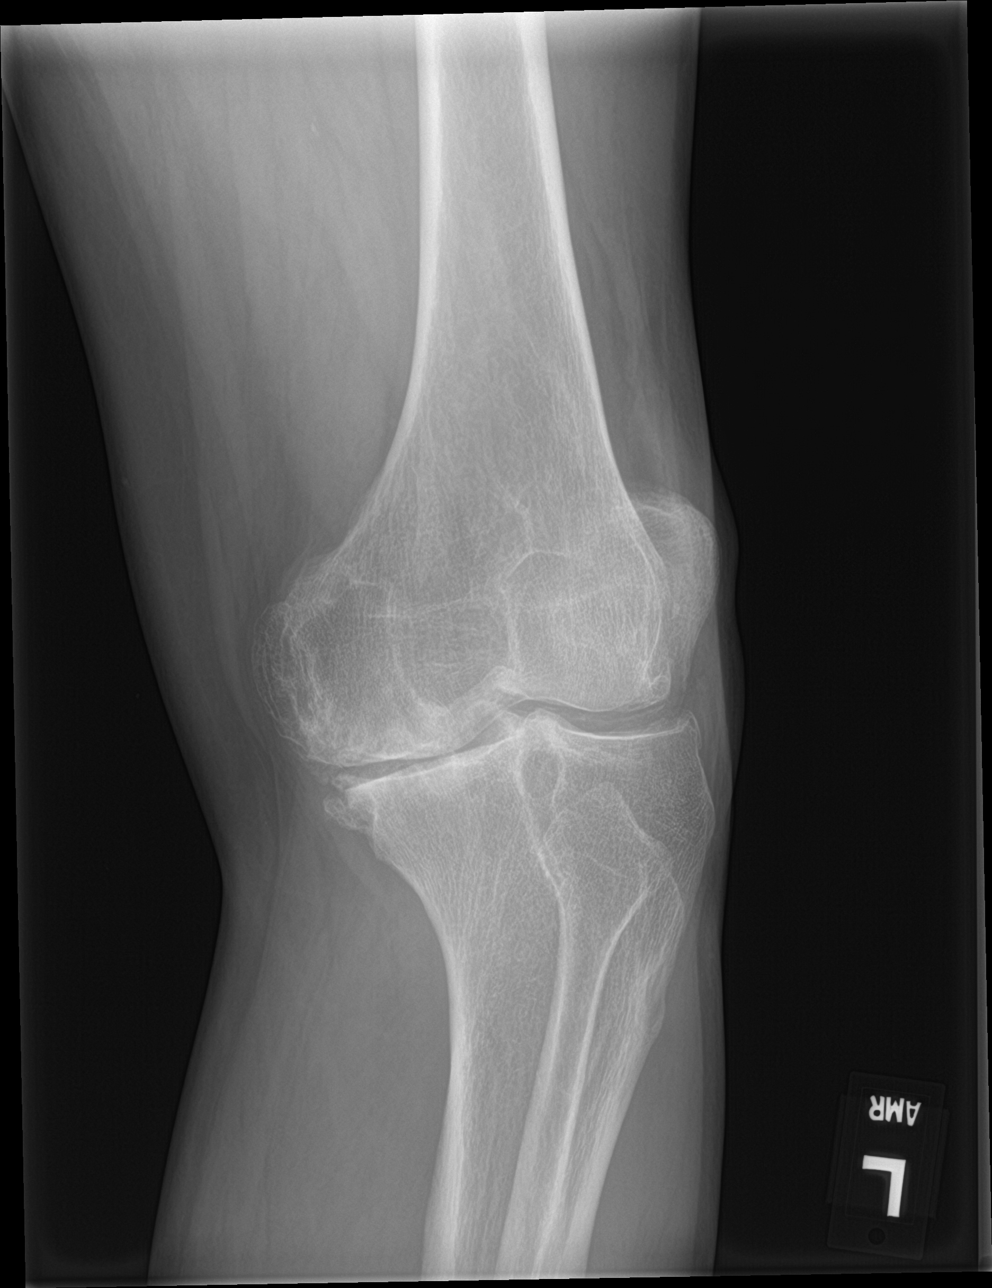

[knee lat]
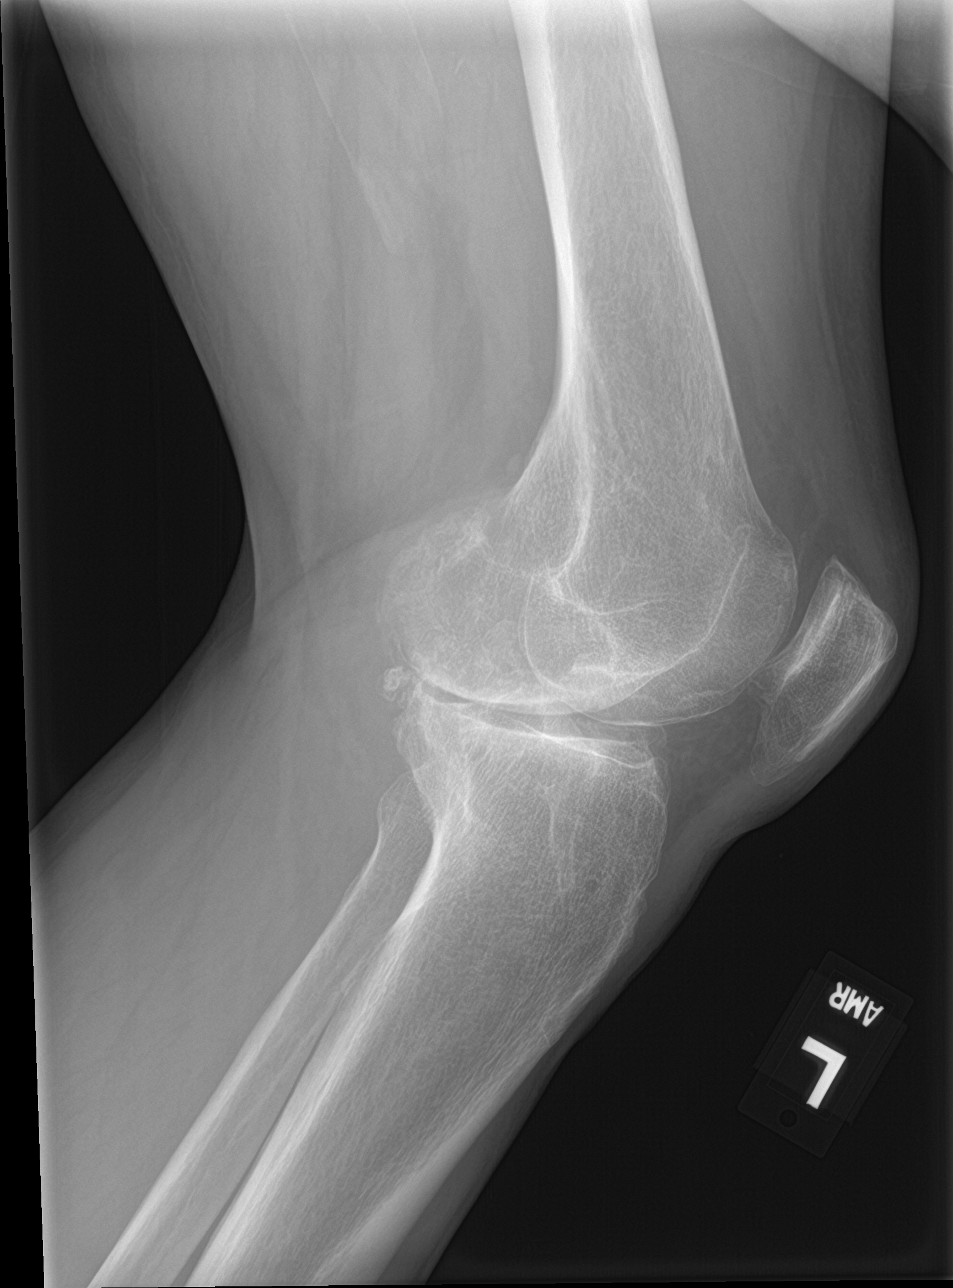

[4 of 4 positions shown; findings below may reference images not displayed]

FINDINGS: No evidence of fracture or dislocation. Small joint effusion.
Chondrocalcinosis with tricompartment degenerative change worse in
the medial compartment. Soft tissues are unremarkable.
IMPRESSION: No acute osseous abnormality.

Tricompartment degenerative joint disease worse in the medial
compartment.

Chondrocalcinosis, as can be seen with CPPD arthropathy.

## 2024-02-01 ENCOUNTER — Encounter: Payer: Self-pay | Admitting: Family Medicine

## 2024-05-19 ENCOUNTER — Encounter: Payer: Self-pay | Admitting: Family Medicine
